# Patient Record
Sex: Female | Born: 2000 | Hispanic: Yes | Marital: Single | Smoking: Never smoker
Health system: Southern US, Community
[De-identification: ages and names within clinical notes are randomized; demographics above are authoritative.]

## PROBLEM LIST (undated history)

## (undated) ENCOUNTER — Ambulatory Visit: Admission: EM | Payer: Medicaid Other | Source: Home / Self Care

## (undated) DIAGNOSIS — J45909 Unspecified asthma, uncomplicated: Secondary | ICD-10-CM

## (undated) DIAGNOSIS — IMO0001 Reserved for inherently not codable concepts without codable children: Secondary | ICD-10-CM

## (undated) DIAGNOSIS — Z464 Encounter for fitting and adjustment of orthodontic device: Secondary | ICD-10-CM

## (undated) HISTORY — PX: TONSILLECTOMY: SUR1361

## (undated) HISTORY — PX: NO PAST SURGERIES: SHX2092

---
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
## 2020-08-20 IMAGING — US US OB LIMITED
1 series · 14 of 14 positions shown · non-contrast
Comparison: none

CLINICAL DATA: Evaluate AFI

EXAM:
LIMITED OBSTETRIC ULTRASOUND

[Series 1: us ob limited · 14 of 14 slices shown]
[im 1/14]
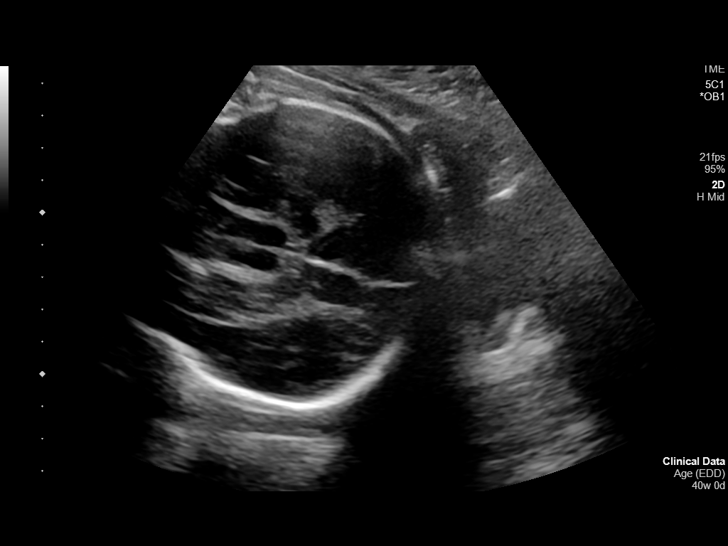
[im 2/14]
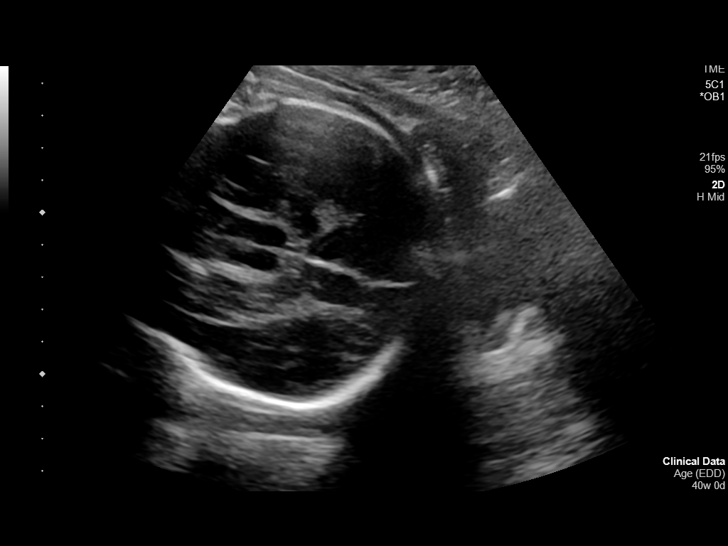
[im 3/14]
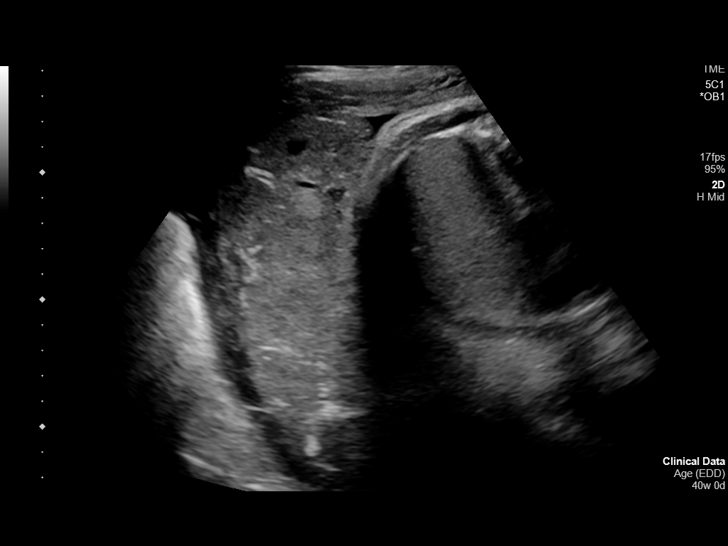
[im 4/14]
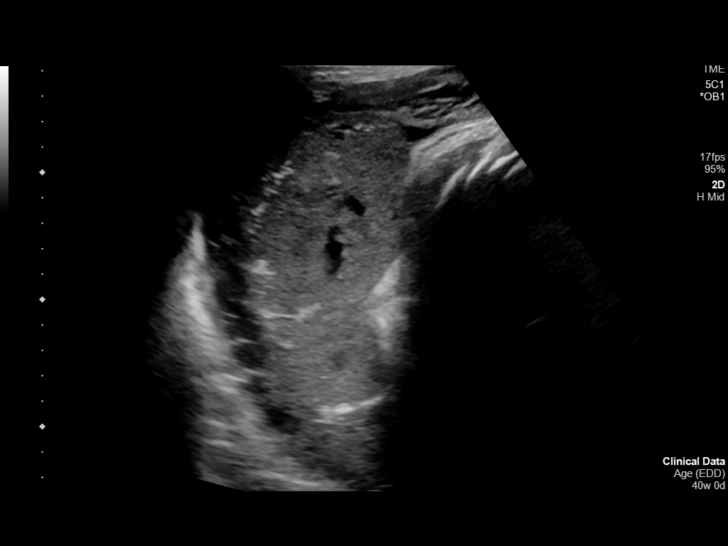
[im 5/14]
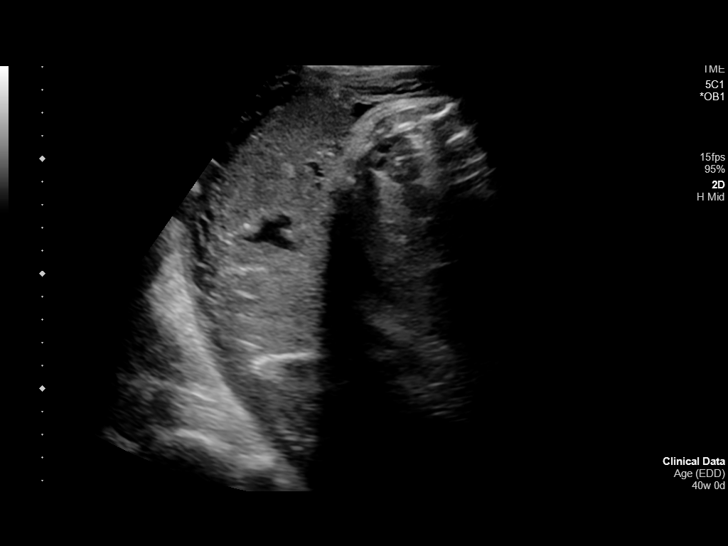
[im 6/14]
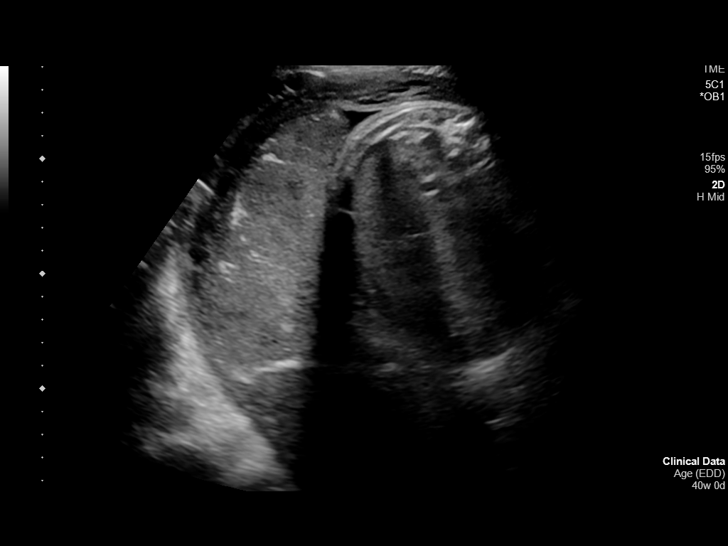
[im 7/14]
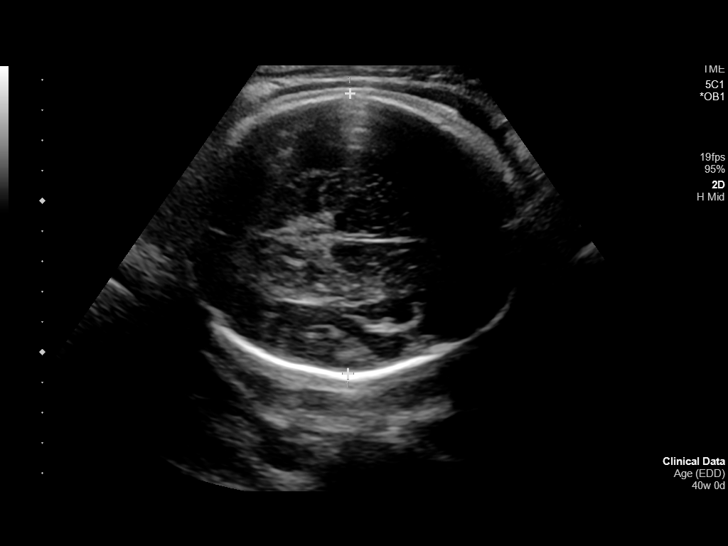
[im 8/14]
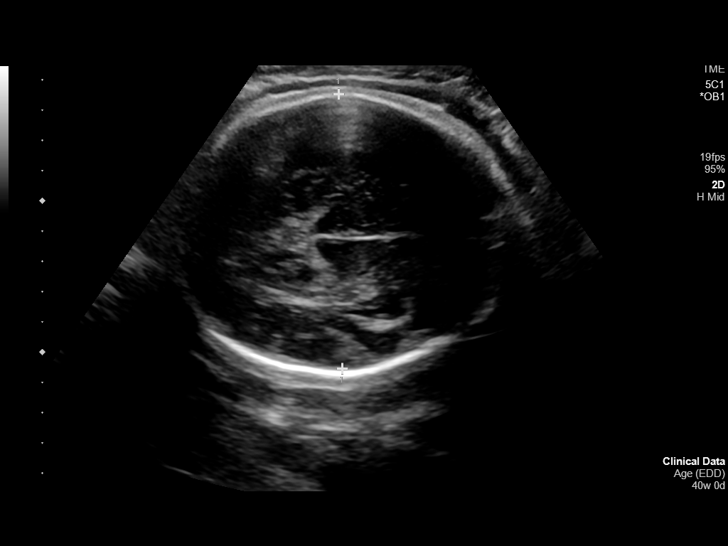
[im 9/14]
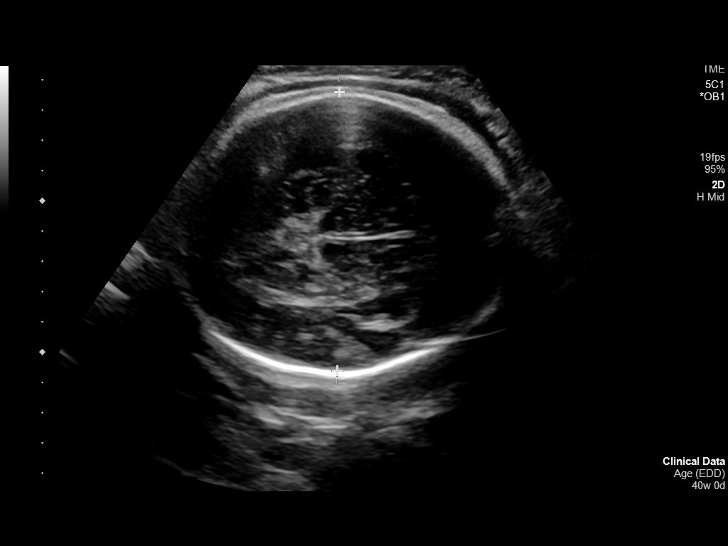
[im 10/14]
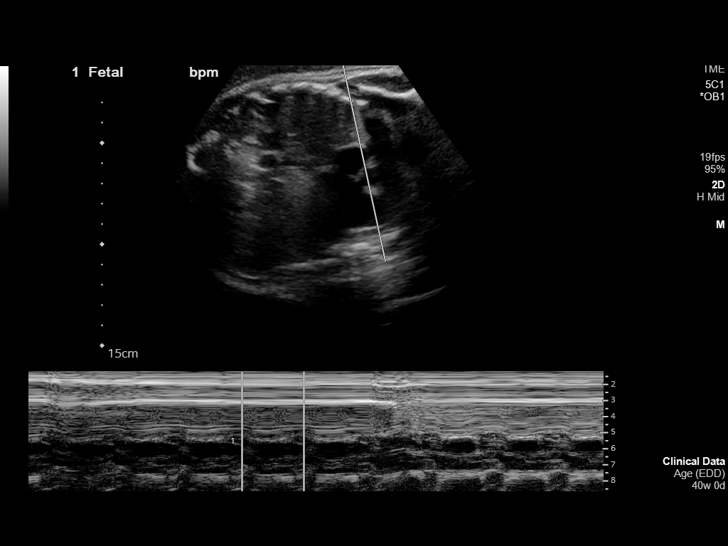
[im 11/14]
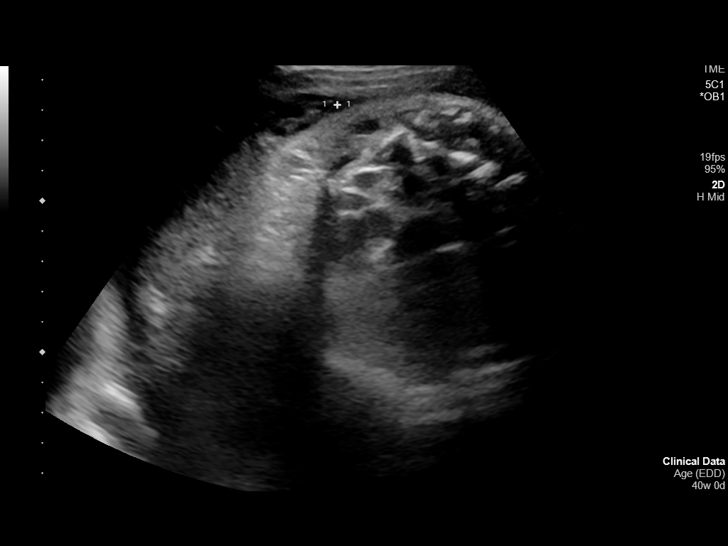
[im 12/14]
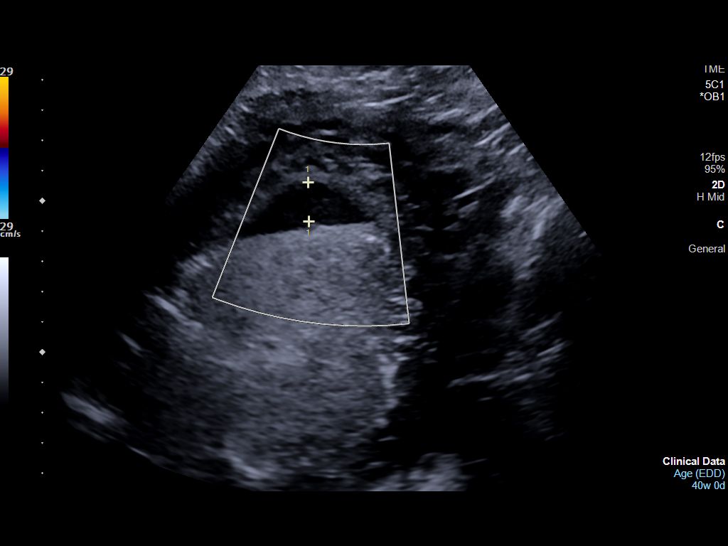
[im 13/14]
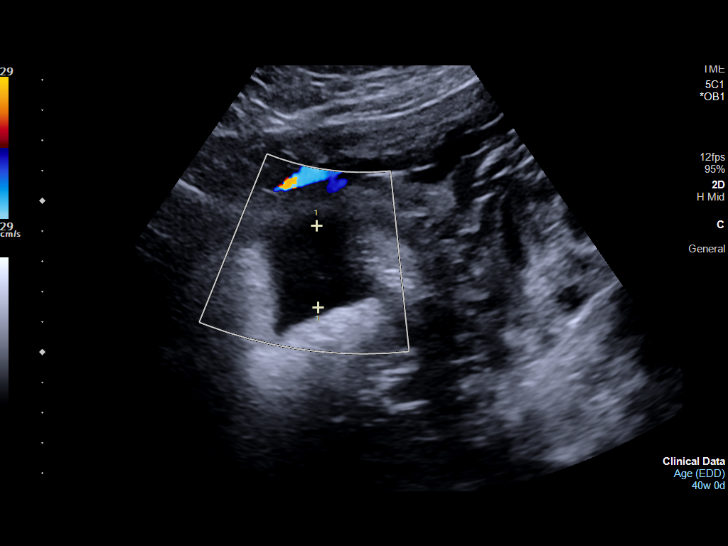
[im 14/14]
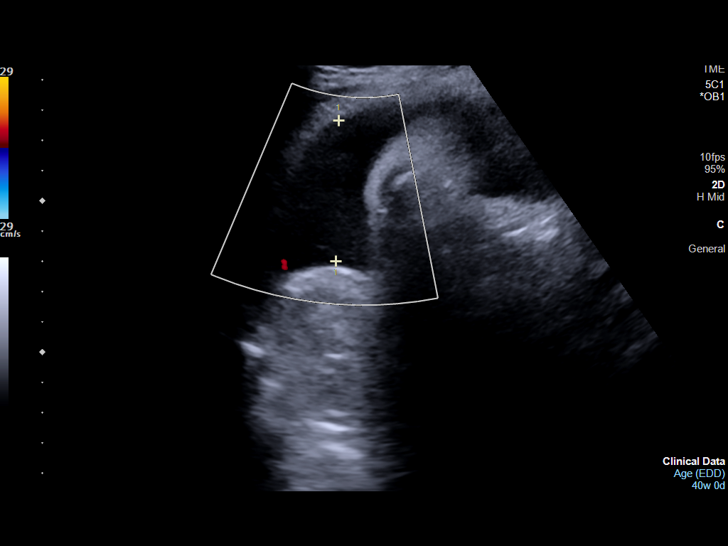

[14 of 14 positions shown; findings below may reference images not displayed]

FINDINGS: Number of Fetuses: 1

Heart Rate:  165 bpm

Movement: Present

Presentation: Cephalic

Placental Location: Right lateral

Previa: Absent

Amniotic Fluid (Subjective):  Normal

AFI: 8.6 cm

BPD: 9.2 cm 37 w  2 d
IMPRESSION: Single live intrauterine gestation at 37 weeks 2 days.

AFI is calculated at 8.6 cm.

This exam is performed on an emergent basis and does not
comprehensively evaluate fetal size, dating, or anatomy; follow-up
complete OB US should be considered if further fetal assessment is
warranted.

## 2020-08-21 ENCOUNTER — Other Ambulatory Visit: Payer: Self-pay

## 2020-08-21 ENCOUNTER — Ambulatory Visit
Admission: EM | Admit: 2020-08-21 | Discharge: 2020-08-21 | Disposition: A | Discharge status: Home / Self Care | Payer: Medicaid Other | Attending: Physician Assistant | Admitting: Physician Assistant

## 2020-08-21 DIAGNOSIS — M25511 Pain in right shoulder: Secondary | ICD-10-CM | POA: Diagnosis not present

## 2020-08-21 DIAGNOSIS — M549 Dorsalgia, unspecified: Secondary | ICD-10-CM | POA: Diagnosis not present

## 2020-08-21 MED ORDER — CYCLOBENZAPRINE HCL 10 MG PO TABS
10.0000 mg | ORAL_TABLET | Freq: Three times a day (TID) | ORAL | 0 refills | Status: DC | PRN
Start: 2020-08-21 — End: 2021-05-29

## 2020-08-21 MED ORDER — MELOXICAM 15 MG PO TABS
15.0000 mg | ORAL_TABLET | Freq: Every day | ORAL | 0 refills | Status: AC
Start: 2020-08-21 — End: 2020-09-05

## 2020-08-21 NOTE — ED Provider Notes (Signed)
[facility]    CSN: [id number] Arrival date & time: 08/21/20  1536      History   Chief Complaint Chief Complaint  Patient presents with   Back Pain    HPI Deborah Cortez is a 20 y.o. female presenting for approximately 3 to 4-day history of right sided upper and mid back pain with radiation to the right shoulder.  Patient denies any injury.  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  She says she has increase in pain whenever she raises her right arm or reaches behind her back.  She also is uncomfortable with lying flat.  Patient denies any lower back pain.  No neck pain. She says that this morning she woke up and her right hand was numb but it is no longer numb.  She denies any weakness and has not dropped anything.  She denies any pain in her chest or shortness of breath.  She says she has tried ibuprofen and Aleve without any improvement in her symptoms.  She denies any similar problems in the past but does admit that she "has back problems."  Patient says that she seen a chiropractor for back pain in the past.  She has no other complaints or concerns.  HPI  Past Medical History:  Diagnosis Date   Asthma    Orthodontics    braces    Patient Active Problem List   Diagnosis Date Noted   Postoperative state 01/23/2019   Spotting affecting pregnancy in third trimester 01/21/2019   Encounter for induction of labor 01/21/2019   Labor and delivery indication for care or intervention 01/21/2019    Past Surgical History:  Procedure Laterality Date   CESAREAN SECTION N/A 01/22/2019   Procedure: CESAREAN SECTION;  Surgeon: Schermerhorn, Ihor Austin, MD;  Location: [facility];  Service: Obstetrics;  Laterality: N/A;   TONSILLECTOMY     TONSILLECTOMY AND ADENOIDECTOMY N/A 03/09/2016   Procedure: TONSILLECTOMY AND ADENOIDECTOMY;  Surgeon: Geanie Logan, MD;  Location: [facility];  Service: ENT;  Laterality: N/A;    OB History     Gravida  1   Para  1   Term  1    Preterm      AB      Living  1      SAB      IAB      Ectopic      Multiple  0   Live Births  1            Home Medications    Prior to Admission medications   Medication Sig Start Date End Date Taking? Authorizing Provider  cyclobenzaprine (FLEXERIL) 10 MG tablet Take 1 tablet (10 mg total) by mouth 3 (three) times daily as needed for muscle spasms. 08/21/20  Yes Shirlee Latch, PA-C  meloxicam (MOBIC) 15 MG tablet Take 1 tablet (15 mg total) by mouth daily for 15 days. 08/21/20 09/05/20 Yes Eusebio Friendly B, PA-C  albuterol (PROVENTIL HFA;VENTOLIN HFA) 108 (90 Base) MCG/ACT inhaler Inhale into the lungs every 6 (six) hours as needed for wheezing or shortness of breath.    [provider]  cetirizine-pseudoephedrine (ZYRTEC-D) 5-120 MG tablet Take 1 tablet by mouth 2 (two) times daily. 07/04/19   Everlene Other G, DO  ipratropium (ATROVENT) 0.06 % nasal spray Place 2 sprays into both nostrils 4 (four) times daily as needed for rhinitis. 07/04/19   Tommie Sams, DO  ondansetron (ZOFRAN ODT) 4 MG disintegrating tablet Take 1  tablet (4 mg total) by mouth every 8 (eight) hours as needed for nausea or vomiting. 07/11/19   Renford Dills, NP  ferrous sulfate 325 (65 FE) MG tablet Take 1 tablet (325 mg total) by mouth 2 (two) times daily with a meal. 01/25/19 07/04/19  Genia Del, CNM    Family History Family History  Problem Relation Age of Onset   Hyperlipidemia Mother        diet controlled   Other Father        unknown - no contact   Leukemia Maternal Grandmother     Social History Social History   Tobacco Use   Smoking status: Never   Smokeless tobacco: Never  Vaping Use   Vaping Use: Never used  Substance Use Topics   Alcohol use: No   Drug use: Never     Allergies   Patient has no known allergies.   Review of Systems Review of Systems  Constitutional:  Negative for fatigue and fever.  Cardiovascular:  Negative for chest pain.   Gastrointestinal:  Negative for abdominal pain, nausea and vomiting.  Genitourinary:  Negative for dysuria, frequency and hematuria.  Musculoskeletal:  Positive for arthralgias and back pain. Negative for gait problem, joint swelling, neck pain and neck stiffness.  Skin:  Negative for rash and wound.  Neurological:  Positive for numbness. Negative for weakness.    Physical Exam Triage Vital Signs ED Triage Vitals [08/21/20 1543]  Enc Vitals Group     BP 116/69     Pulse Rate 90     Resp 16     Temp 99.2 F (37.3 C)     Temp Source Oral     SpO2 96 %     Weight 172 lb (78 kg)     Height 5\' 3"  (1.6 m)     Head Circumference      Peak Flow      Pain Score 6     Pain Loc      Pain Edu?      Excl. in [place]?    No data found.  Updated Vital Signs BP 116/69   Pulse 90   Temp 99.2 F (37.3 C) (Oral)   Resp 16   Ht 5\' 3"  (1.6 m)   Wt 172 lb (78 kg)   LMP 08/14/2020   SpO2 96%   BMI 30.47 kg/m       Physical Exam Vitals and nursing note reviewed.  Constitutional:      General: She is not in acute distress.    Appearance: Normal appearance. She is not ill-appearing or toxic-appearing.  HENT:     Head: Normocephalic and atraumatic.  Eyes:     General: No scleral icterus.       Right eye: No discharge.        Left eye: No discharge.     Conjunctiva/sclera: Conjunctivae normal.  Cardiovascular:     Rate and Rhythm: Normal rate and regular rhythm.     Heart sounds: Normal heart sounds.  Pulmonary:     Effort: Pulmonary effort is normal. No respiratory distress.     Breath sounds: Normal breath sounds.  Abdominal:     Palpations: Abdomen is soft.     Tenderness: There is no abdominal tenderness. There is no right CVA tenderness or left CVA tenderness.  Musculoskeletal:     Cervical back: Normal and neck supple.     Thoracic back: Tenderness (Diffuse TTP right scapular region, right parathoracic vertebral muscles and  right anterior shoulder) present. No bony  tenderness. Normal range of motion.     Lumbar back: Normal.       Back:     Comments: FROM of shoulder but has pain with full flexion and reaching behind her back. 5/5 strength bilat UEs  Skin:    General: Skin is dry.  Neurological:     General: No focal deficit present.     Mental Status: She is alert. Mental status is at baseline.     Motor: No weakness.     Gait: Gait normal.  Psychiatric:        Mood and Affect: Mood normal.        Behavior: Behavior normal.        Thought Content: Thought content normal.     UC Treatments / Results  Labs (all labs ordered are listed, but only abnormal results are displayed) Labs Reviewed - No data to display  EKG   Radiology No results found.  Procedures Procedures (including critical care time)  Medications Ordered in UC Medications - No data to display  Initial Impression / Assessment and Plan / UC Course  I have reviewed the triage vital signs and the nursing notes.  Pertinent labs & imaging results that were available during my care of the patient were reviewed by me and considered in my medical decision making (see chart for details).  20 year old female presenting for right upper back pain, right shoulder pain with occasional radiation down the right arm just above the right elbow.  Hand numbness this morning but resolved.  No neck pain and no lower back pain.  Exam today is significant for diffuse tenderness to palpation of the posterior scapular region, thoracic paravertebral muscles and anterior shoulder.  Full range of motion of shoulder but has pain with full flexion and reaching behind back.  5 of 5 strength bilateral extremities.  The remainder the exam is within normal limits.  Suspect patient has strain of upper back and shoulder, possibly related to work or lifting her child.  Suggested conservative care.  I have sent in meloxicam and advised she can also take Tylenol.  Have also sent in cyclobenzaprine.  Advised  various muscle rubs and lidocaine patches.  Advised to see orthopedics if she is not improving over the next 7 to 10 days or for any worsening of symptoms.  Work note given for today and tomorrow if needed.  ED precautions reviewed.  Final Clinical Impressions(s) / UC Diagnoses   Final diagnoses:  Upper back pain on right side  Acute pain of right shoulder     Discharge Instructions      BACK/SHOULDER PAIN: Stressed avoiding painful activities . RICE (REST, ICE, COMPRESSION, ELEVATION) guidelines reviewed. May alternate ice and heat. Consider use of muscle rubs, Salonpas patches, etc. Use medications as directed including muscle relaxers if prescribed. Take anti-inflammatory medications as prescribed or OTC NSAIDs/Tylenol.  F/u with PCP in 7-10 days for reexamination, and please feel free to call or return to the urgent care at any time for any questions or concerns you may have and we will be happy to help you!   BACK PAIN RED FLAGS: If the back pain acutely worsens or there are any red flag symptoms such as numbness/tingling, leg weakness, saddle anesthesia, or loss of bowel/bladder control, go immediately to the ER. Follow up with us as scheduled or sooner if the pain does not begin to resolve or if it worsens before the follow up  ED Prescriptions     Medication Sig Dispense Auth. Provider   meloxicam (MOBIC) 15 MG tablet Take 1 tablet (15 mg total) by mouth daily for 15 days. 15 tablet Eusebio Friendly B, PA-C   cyclobenzaprine (FLEXERIL) 10 MG tablet Take 1 tablet (10 mg total) by mouth 3 (three) times daily as needed for muscle spasms. 20 tablet Gareth Morgan      PDMP not reviewed this encounter.   Shirlee Latch, PA-C 08/21/20 1626

## 2020-08-21 NOTE — Discharge Instructions (Addendum)
BACK/SHOULDER PAIN: Stressed avoiding painful activities . RICE (REST, ICE, COMPRESSION, ELEVATION) guidelines reviewed. May alternate ice and heat. Consider use of muscle rubs, Salonpas patches, etc. Use medications as directed including muscle relaxers if prescribed. Take anti-inflammatory medications as prescribed or OTC NSAIDs/Tylenol.  F/u with PCP in 7-10 days for reexamination, and please feel free to call or return to the urgent care at any time for any questions or concerns you may have and we will be happy to help you!   BACK PAIN RED FLAGS: If the back pain acutely worsens or there are any red flag symptoms such as numbness/tingling, leg weakness, saddle anesthesia, or loss of bowel/bladder control, go immediately to the ER. Follow up with [place] as scheduled or sooner if the pain does not begin to resolve or if it worsens before the follow up

## 2020-08-21 NOTE — ED Triage Notes (Signed)
Pt reports having R sided back pain since Monday. No known injury to back. Denies having any other symptoms.

## 2021-05-29 ENCOUNTER — Other Ambulatory Visit: Payer: Self-pay

## 2021-05-29 ENCOUNTER — Ambulatory Visit
Admission: EM | Admit: 2021-05-29 | Discharge: 2021-05-29 | Disposition: A | Discharge status: Home / Self Care | Payer: Medicaid Other | Attending: Internal Medicine | Admitting: Internal Medicine

## 2021-05-29 ENCOUNTER — Encounter: Payer: Self-pay | Admitting: Emergency Medicine

## 2021-05-29 DIAGNOSIS — N760 Acute vaginitis: Secondary | ICD-10-CM | POA: Insufficient documentation

## 2021-05-29 DIAGNOSIS — R1030 Lower abdominal pain, unspecified: Secondary | ICD-10-CM | POA: Insufficient documentation

## 2021-05-29 DIAGNOSIS — B9689 Other specified bacterial agents as the cause of diseases classified elsewhere: Secondary | ICD-10-CM | POA: Insufficient documentation

## 2021-05-29 LAB — URINALYSIS, ROUTINE W REFLEX MICROSCOPIC
Bilirubin Urine: NEGATIVE
Glucose, UA: NEGATIVE mg/dL
Hgb urine dipstick: NEGATIVE
Ketones, ur: NEGATIVE mg/dL
Leukocytes,Ua: NEGATIVE
Nitrite: NEGATIVE
Protein, ur: NEGATIVE mg/dL
Specific Gravity, Urine: 1.01 (ref 1.005–1.030)
pH: 7 (ref 5.0–8.0)

## 2021-05-29 LAB — WET PREP, GENITAL
Sperm: NONE SEEN
Trich, Wet Prep: NONE SEEN
WBC, Wet Prep HPF POC: <10 — AB (ref <10)

## 2021-05-29 LAB — PREGNANCY, URINE: Preg Test, Ur: NEGATIVE

## 2021-05-29 MED ORDER — METRONIDAZOLE 500 MG PO TABS: 500.0000 mg | ORAL_TABLET | Freq: Two times a day (BID) | ORAL | 0 refills | Status: AC

## 2021-05-29 NOTE — ED Triage Notes (Signed)
Patient c/o abdominal pain off and for the past week.  Patient denies any urinary symptoms.  Patient denies vaginal discharge.  Patient states that she is sexually active.  Patient denies fevers.  Patient reports some nausea.  Patient denies V/D.  Patient reports normal bowel movement this morning.  ?

## 2021-05-29 NOTE — ED Provider Notes (Signed)
?[facility] ? ? ? ?CSN: [id number] ?Arrival date & time: 05/29/21  1518 ? ? ?  ? ?History   ?Chief Complaint ?Chief Complaint  ?Patient presents with  ? Abdominal Pain  ? ? ?HPI ?Deborah Cortez is a 21 y.o. female comes to the urgent care with 1 week history of intermittent lower abdominal pain.  Pain is sharp, started spontaneously and aggravated by assuming certain positions.  Patient denies any relieving factors.  No nausea, vomiting or diarrhea.  She has a history of constipation but her bowel movements have been regular recently.  Last bowel movement was this morning with no blood.  Bowel movement was soft.  No abdominal bloating or distention.  Patient has regular periods with the last menstrual period ending at the beginning of April.  She is not sexually active and denies any vaginal discharge or vaginal odor.  No dysuria urgency or frequency.  No trauma to the abdomen or heavy lifting.  No pain in the inguinal areas.  No fever or chills. ?HPI ? ?Past Medical History:  ?Diagnosis Date  ? Asthma   ? Orthodontics   ? braces  ? ? ?Patient Active Problem List  ? Diagnosis Date Noted  ? Postoperative state 01/23/2019  ? Spotting affecting pregnancy in third trimester 01/21/2019  ? Encounter for induction of labor 01/21/2019  ? Labor and delivery indication for care or intervention 01/21/2019  ? ? ?Past Surgical History:  ?Procedure Laterality Date  ? CESAREAN SECTION N/A 01/22/2019  ? Procedure: CESAREAN SECTION;  Surgeon: Schermerhorn, Gwen Her, MD;  Location: [facility];  Service: Obstetrics;  Laterality: N/A;  ? TONSILLECTOMY    ? TONSILLECTOMY AND ADENOIDECTOMY N/A 03/09/2016  ? Procedure: TONSILLECTOMY AND ADENOIDECTOMY;  Surgeon: Clyde Canterbury, MD;  Location: [facility];  Service: ENT;  Laterality: N/A;  ? ? ?OB History   ? ? Gravida  ?1  ? Para  ?1  ? Term  ?1  ? Preterm  ?   ? AB  ?   ? Living  ?1  ?  ? ? SAB  ?   ? IAB  ?   ? Ectopic  ?   ? Multiple  ?0  ? Live Births  ?1  ?    ?  ?  ? ? ? ?Home Medications   ? ?Prior to Admission medications   ?Medication Sig Start Date End Date Taking? Authorizing Provider  ?metroNIDAZOLE (FLAGYL) 500 MG tablet Take 1 tablet (500 mg total) by mouth 2 (two) times daily for 7 days. 05/29/21 06/05/21 Yes Sevon Rotert, Myrene Galas, MD  ?albuterol (PROVENTIL HFA;VENTOLIN HFA) 108 (90 Base) MCG/ACT inhaler Inhale into the lungs every 6 (six) hours as needed for wheezing or shortness of breath.    [provider]  ?cetirizine-pseudoephedrine (ZYRTEC-D) 5-120 MG tablet Take 1 tablet by mouth 2 (two) times daily. 07/04/19   Coral Spikes, DO  ?ipratropium (ATROVENT) 0.06 % nasal spray Place 2 sprays into both nostrils 4 (four) times daily as needed for rhinitis. 07/04/19   Coral Spikes, DO  ?ferrous sulfate 325 (65 FE) MG tablet Take 1 tablet (325 mg total) by mouth 2 (two) times daily with a meal. 01/25/19 07/04/19  Lisette Grinder, CNM  ? ? ?Family History ?Family History  ?Problem Relation Age of Onset  ? Hyperlipidemia Mother   ?     diet controlled  ? Other Father   ?     unknown - no contact  ? Leukemia  Maternal Grandmother   ? ? ?Social History ?Social History  ? ?Tobacco Use  ? Smoking status: Never  ? Smokeless tobacco: Never  ?Vaping Use  ? Vaping Use: Never used  ?Substance Use Topics  ? Alcohol use: No  ? Drug use: Never  ? ? ? ?Allergies   ?Patient has no known allergies. ? ? ?Review of Systems ?Review of Systems ?As per HPI ? ?Physical Exam ?Triage Vital Signs ?ED Triage Vitals  ?Enc Vitals Group  ?   BP 05/29/21 1531 118/77  ?   Pulse Rate 05/29/21 1531 70  ?   Resp 05/29/21 1531 14  ?   Temp 05/29/21 1531 98.7 ?F (37.1 ?C)  ?   Temp Source 05/29/21 1531 Oral  ?   SpO2 05/29/21 1531 98 %  ?   Weight 05/29/21 1527 166 lb (75.3 kg)  ?   Height --   ?   Head Circumference --   ?   Peak Flow --   ?   Pain Score 05/29/21 1527 7  ?   Pain Loc --   ?   Pain Edu? --   ?   Excl. in [place]? --   ? ?No data found. ? ?Updated Vital Signs ?BP 118/77 (BP Location:  Left Arm)   Pulse 70   Temp 98.7 ?F (37.1 ?C) (Oral)   Resp 14   Wt 75.3 kg   LMP 05/10/2021 (Approximate)   SpO2 98%   BMI 29.41 kg/m?  ? ?Visual Acuity ?Right Eye Distance:   ?Left Eye Distance:   ?Bilateral Distance:   ? ?Right Eye Near:   ?Left Eye Near:    ?Bilateral Near:    ? ?Physical Exam ?Vitals and nursing note reviewed.  ?Constitutional:   ?   General: She is not in acute distress. ?   Appearance: She is not ill-appearing.  ?Cardiovascular:  ?   Rate and Rhythm: Normal rate and regular rhythm.  ?Pulmonary:  ?   Effort: Pulmonary effort is normal.  ?   Breath sounds: Normal breath sounds.  ?Abdominal:  ?   Palpations: Abdomen is soft. There is no shifting dullness or fluid wave.  ?   Tenderness: There is abdominal tenderness in the suprapubic area. There is no right CVA tenderness, left CVA tenderness, guarding or rebound. Negative signs include Murphy's sign, McBurney's sign and obturator sign.  ?   Hernia: No hernia is present.  ?Neurological:  ?   Mental Status: She is alert.  ? ? ? ?UC Treatments / Results  ?Labs ?(all labs ordered are listed, but only abnormal results are displayed) ?Labs Reviewed  ?WET PREP, GENITAL - Abnormal; Notable for the following components:  ?    Result Value  ? Yeast Wet Prep HPF POC PRESENT (*)   ? Clue Cells Wet Prep HPF POC PRESENT (*)   ? WBC, Wet Prep HPF POC <10 (*)   ? All other components within normal limits  ?URINALYSIS, ROUTINE W REFLEX MICROSCOPIC  ?PREGNANCY, URINE  ? ? ?EKG ? ? ?Radiology ?No results found. ? ?Procedures ?Procedures (including critical care time) ? ?Medications Ordered in UC ?Medications - No data to display ? ?Initial Impression / Assessment and Plan / UC Course  ?I have reviewed the triage vital signs and the nursing notes. ? ?Pertinent labs & imaging results that were available during my care of the patient were reviewed by me and considered in my medical decision making (see chart for details). ? ?  ? ?  1.  Lower abdominal pain: ?I  suspect anterior abdominal wall pain ?Abdominal exam is without guarding or rebound tenderness.  McBurney's point tenderness is negative.  No signs of peritoneal irritation. ?Urinalysis is unremarkable ?Wet prep was positive for bacterial vaginosis. ?Flagyl twice daily for 7 days ?Patient is advised to follow-up with OB/GYN for transvaginal ultrasound to evaluate uterus and ovaries.  Abdominal exam is unimpressive for peritonitis. ?Return precautions recommended. ?Final Clinical Impressions(s) / UC Diagnoses  ? ?Final diagnoses:  ?Lower abdominal pain  ?Bacterial vaginosis  ? ? ? ?Discharge Instructions   ? ?  ?Please take medications as prescribed ?Do not drink alcohol while is on the medications ?Maintain adequate hydration ?You will need to follow-up with gynecologist for transvaginal ultrasound if abdominal pain persist ?Return to urgent care if symptoms worsen. ? ? ?ED Prescriptions   ? ? Medication Sig Dispense Auth. Provider  ? metroNIDAZOLE (FLAGYL) 500 MG tablet Take 1 tablet (500 mg total) by mouth 2 (two) times daily for 7 days. 14 tablet Ringo Sherod, Myrene Galas, MD  ? ?  ? ?PDMP not reviewed this encounter. ?  ?Chase Picket, MD ?05/29/21 1627 ? ?

## 2021-05-29 NOTE — Discharge Instructions (Signed)
Please take medications as prescribed ?Do not drink alcohol while is on the medications ?Maintain adequate hydration ?You will need to follow-up with gynecologist for transvaginal ultrasound if abdominal pain persist ?Return to urgent care if symptoms worsen. ?

## 2021-07-16 ENCOUNTER — Ambulatory Visit
Admission: EM | Admit: 2021-07-16 | Discharge: 2021-07-16 | Disposition: A | Discharge status: Home / Self Care | Payer: Medicaid Other | Attending: Emergency Medicine | Admitting: Emergency Medicine

## 2021-07-16 DIAGNOSIS — J069 Acute upper respiratory infection, unspecified: Secondary | ICD-10-CM | POA: Diagnosis not present

## 2021-07-16 DIAGNOSIS — H66002 Acute suppurative otitis media without spontaneous rupture of ear drum, left ear: Secondary | ICD-10-CM

## 2021-07-16 MED ORDER — AMOXICILLIN-POT CLAVULANATE 875-125 MG PO TABS: 1.0000 | ORAL_TABLET | Freq: Two times a day (BID) | ORAL | 0 refills | Status: AC

## 2021-07-16 MED ORDER — IPRATROPIUM BROMIDE 0.06 % NA SOLN: 2.0000 | Freq: Four times a day (QID) | NASAL | 12 refills | Status: DC

## 2021-07-16 MED ORDER — PROMETHAZINE-DM 6.25-15 MG/5ML PO SYRP: 5.0000 mL | ORAL_SOLUTION | Freq: Four times a day (QID) | ORAL | 0 refills | Status: DC | PRN

## 2021-07-16 NOTE — Discharge Instructions (Signed)
Take the Augmentin twice daily for 10 days with food for treatment of your ear infection.  Take an over-the-counter probiotic 1 hour after each dose of antibiotic to prevent diarrhea.  Use over-the-counter Tylenol and ibuprofen as needed for pain or fever.  Place a hot water bottle, or heating pad, underneath your pillowcase at night to help dilate up your ear and aid in pain relief as well as resolution of the infection.  Use the Atrovent nasal spray, 2 squirts in each nostril every 6 hours, as needed for runny nose and postnasal drip.  Use the Promethazine DM cough syrup at bedtime for cough and congestion.  It will make you drowsy so do not take it during the day.  Return for reevaluation or see your primary care provider for any new or worsening symptoms.   Return for reevaluation for any new or worsening symptoms.  

## 2021-07-16 NOTE — ED Triage Notes (Signed)
Patient presents to UC for a cough for about 2 weeks.  Patient states it affects her more at night.  She has been taking OTC medication but this has not helped.  Woke up this AM with body aches.  Patient denies fevers.

## 2021-07-16 NOTE — ED Provider Notes (Signed)
[facility]    CSN: [id number] Arrival date & time: 07/16/21  1435      History   Chief Complaint Chief Complaint  Patient presents with   Cough   Generalized Body Aches    HPI Tommie ArdBeatriz N Parada Dominguez is a 21 y.o. female.   HPI  21 year old female here for evaluation of respiratory complaints.  Patient reports that for the last 2 weeks she has been experiencing a cough that is predominant at night and has been dry until recently.  She now reports that her cough is starting to become more productive.  She is also developed runny nose, nasal congestion, ear pain, and sore throat in the last 1 to 2 days.  This morning she woke up with some body aches.  She denies any fever, shortness of breath, or wheezing.  Past Medical History:  Diagnosis Date   Asthma    Orthodontics    braces    Patient Active Problem List   Diagnosis Date Noted   Postoperative state 01/23/2019   Spotting affecting pregnancy in third trimester 01/21/2019   Encounter for induction of labor 01/21/2019   Labor and delivery indication for care or intervention 01/21/2019    Past Surgical History:  Procedure Laterality Date   CESAREAN SECTION N/A 01/22/2019   Procedure: CESAREAN SECTION;  Surgeon: Schermerhorn, Ihor Austinhomas J, MD;  Location: [facility];  Service: Obstetrics;  Laterality: N/A;   TONSILLECTOMY     TONSILLECTOMY AND ADENOIDECTOMY N/A 03/09/2016   Procedure: TONSILLECTOMY AND ADENOIDECTOMY;  Surgeon: Geanie LoganPaul Bennett, MD;  [facility];  Service: ENT;  Laterality: N/A;    OB History     Gravida  1   Para  1   Term  1   Preterm      AB      Living  1      SAB      IAB      Ectopic      Multiple  0   Live Births  1            Home Medications    Prior to Admission medications   Medication Sig Start Date End Date Taking? Authorizing Provider  amoxicillin-clavulanate (AUGMENTIN) 875-125 MG tablet Take 1 tablet by mouth every 12 (twelve) hours  for 10 days. 07/16/21 07/26/21 Yes Becky Augustayan, Orion Mole, NP  ipratropium (ATROVENT) 0.06 % nasal spray Place 2 sprays into both nostrils 4 (four) times daily. 07/16/21  Yes Becky Augustayan, Sueellen Kayes, NP  promethazine-dextromethorphan (PROMETHAZINE-DM) 6.25-15 MG/5ML syrup Take 5 mLs by mouth 4 (four) times daily as needed. 07/16/21  Yes Becky Augustayan, Adell Koval, NP  albuterol (PROVENTIL HFA;VENTOLIN HFA) 108 (90 Base) MCG/ACT inhaler Inhale into the lungs every 6 (six) hours as needed for wheezing or shortness of breath.    [provider]  ferrous sulfate 325 (65 FE) MG tablet Take 1 tablet (325 mg total) by mouth 2 (two) times daily with a meal. 01/25/19 07/04/19  Genia DelHaviland, Margaret, CNM    Family History Family History  Problem Relation Age of Onset   Hyperlipidemia Mother        diet controlled   Other Father        unknown - no contact   Leukemia Maternal Grandmother     Social History Social History   Tobacco Use   Smoking status: Never   Smokeless tobacco: Never  Vaping Use   Vaping Use: Never used  Substance Use Topics   Alcohol use: No  Drug use: Never     Allergies   Patient has no known allergies.   Review of Systems Review of Systems  Constitutional:  Negative for fever.  HENT:  Positive for congestion, ear pain, rhinorrhea and sore throat.   Respiratory:  Positive for cough. Negative for shortness of breath and wheezing.   Hematological: Negative.   Psychiatric/Behavioral: Negative.       Physical Exam Triage Vital Signs ED Triage Vitals  Enc Vitals Group     BP 07/16/21 1449 108/83     Pulse Rate 07/16/21 1449 74     Resp --      Temp 07/16/21 1449 98.3 F (36.8 C)     Temp Source 07/16/21 1449 Oral     SpO2 07/16/21 1449 100 %     Weight 07/16/21 1447 167 lb (75.8 kg)     Height 07/16/21 1447 5\' 3"  (1.6 m)     Head Circumference --      Peak Flow --      Pain Score 07/16/21 1447 6     Pain Loc --      Pain Edu? --      Excl. in [place]? --    No data found.  Updated  Vital Signs BP 108/83 (BP Location: Right Arm)   Pulse 74   Temp 98.3 F (36.8 C) (Oral)   Ht 5\' 3"  (1.6 m)   Wt 167 lb (75.8 kg)   LMP 06/29/2021   SpO2 100%   BMI 29.58 kg/m   Visual Acuity Right Eye Distance:   Left Eye Distance:   Bilateral Distance:    Right Eye Near:   Left Eye Near:    Bilateral Near:     Physical Exam Vitals and nursing note reviewed.  Constitutional:      Appearance: Normal appearance. She is not ill-appearing.  HENT:     Head: Normocephalic and atraumatic.     Right Ear: Tympanic membrane, ear canal and external ear normal. There is no impacted cerumen.     Left Ear: Ear canal and external ear normal. There is no impacted cerumen.     Nose: Congestion and rhinorrhea present.     Mouth/Throat:     Mouth: Mucous membranes are moist.     Pharynx: Oropharynx is clear. Posterior oropharyngeal erythema present.  Cardiovascular:     Rate and Rhythm: Normal rate and regular rhythm.     Pulses: Normal pulses.     Heart sounds: Normal heart sounds. No murmur heard.    No friction rub. No gallop.  Pulmonary:     Effort: Pulmonary effort is normal.     Breath sounds: Normal breath sounds. No wheezing, rhonchi or rales.  Musculoskeletal:     Cervical back: Normal range of motion and neck supple.  Lymphadenopathy:     Cervical: No cervical adenopathy.  Skin:    General: Skin is warm and dry.     Capillary Refill: Capillary refill takes less than 2 seconds.     Findings: No erythema or rash.  Neurological:     General: No focal deficit present.     Mental Status: She is alert and oriented to person, place, and time.  Psychiatric:        Mood and Affect: Mood normal.        Behavior: Behavior normal.        Thought Content: Thought content normal.        Judgment: Judgment normal.  UC Treatments / Results  Labs (all labs ordered are listed, but only abnormal results are displayed) Labs Reviewed - No data to  display  EKG   Radiology No results found.  Procedures Procedures (including critical care time)  Medications Ordered in UC Medications - No data to display  Initial Impression / Assessment and Plan / UC Course  I have reviewed the triage vital signs and the nursing notes.  Pertinent labs & imaging results that were available during my care of the patient were reviewed by me and considered in my medical decision making (see chart for details).  Patient is a very pleasant, nontoxic-appearing 21 year old female here for evaluation of respiratory complaints as outlined HPI above.  Her physical exam reveals an erythematous and injected left tympanic membrane.  The external auditory canal is clear.  Right TM is pearly gray in appearance with normal light reflex and clear external auditory canal.  Nasal mucosa is erythematous and edematous with clear discharge in both nares.  Oropharyngeal exam reveals posterior oropharyngeal erythema and clear postnasal drip.  No anterior cervical of adenopathy appreciable exam.  Cardiopulmonary exam feels S1-S2 heart sounds with regular rate and rhythm and lung sounds that are clear to auscultation in all fields.  Patient exam is consistent with an upper respiratory infection and otitis media of the left ear.  I will place her on Augmentin twice daily for 10 days for treatment of her otitis media and prescribe Atrovent nasal spray to help with nasal congestion and postnasal drip and Promethazine DM cough syrup to help with cough at bedtime.  Work note provided   Final Clinical Impressions(s) / UC Diagnoses   Final diagnoses:  Upper respiratory tract infection, unspecified type  Non-recurrent acute suppurative otitis media of left ear without spontaneous rupture of tympanic membrane     Discharge Instructions      Take the Augmentin twice daily for 10 days with food for treatment of your ear infection.  Take an over-the-counter probiotic 1 hour after  each dose of antibiotic to prevent diarrhea.  Use over-the-counter Tylenol and ibuprofen as needed for pain or fever.  Place a hot water bottle, or heating pad, underneath your pillowcase at night to help dilate up your ear and aid in pain relief as well as resolution of the infection.  Use the Atrovent nasal spray, 2 squirts in each nostril every 6 hours, as needed for runny nose and postnasal drip.  Use the Promethazine DM cough syrup at bedtime for cough and congestion.  It will make you drowsy so do not take it during the day.  Return for reevaluation or see your primary care provider for any new or worsening symptoms.   Return for reevaluation for any new or worsening symptoms.      ED Prescriptions     Medication Sig Dispense Auth. Provider   amoxicillin-clavulanate (AUGMENTIN) 875-125 MG tablet Take 1 tablet by mouth every 12 (twelve) hours for 10 days. 20 tablet Becky Augusta, NP   ipratropium (ATROVENT) 0.06 % nasal spray Place 2 sprays into both nostrils 4 (four) times daily. 15 mL Becky Augusta, NP   promethazine-dextromethorphan (PROMETHAZINE-DM) 6.25-15 MG/5ML syrup Take 5 mLs by mouth 4 (four) times daily as needed. 118 mL Becky Augusta, NP      PDMP not reviewed this encounter.   Becky Augusta, NP 07/16/21 [phone]

## 2021-10-22 ENCOUNTER — Ambulatory Visit
Admission: EM | Admit: 2021-10-22 | Discharge: 2021-10-22 | Disposition: A | Discharge status: Home / Self Care | Payer: Medicaid Other | Attending: Family Medicine | Admitting: Family Medicine

## 2021-10-22 DIAGNOSIS — M545 Low back pain, unspecified: Secondary | ICD-10-CM | POA: Diagnosis not present

## 2021-10-22 DIAGNOSIS — R0981 Nasal congestion: Secondary | ICD-10-CM | POA: Insufficient documentation

## 2021-10-22 DIAGNOSIS — B9789 Other viral agents as the cause of diseases classified elsewhere: Secondary | ICD-10-CM | POA: Insufficient documentation

## 2021-10-22 DIAGNOSIS — J329 Chronic sinusitis, unspecified: Secondary | ICD-10-CM | POA: Insufficient documentation

## 2021-10-22 DIAGNOSIS — K5900 Constipation, unspecified: Secondary | ICD-10-CM | POA: Insufficient documentation

## 2021-10-22 DIAGNOSIS — Z20822 Contact with and (suspected) exposure to covid-19: Secondary | ICD-10-CM | POA: Diagnosis not present

## 2021-10-22 LAB — SARS CORONAVIRUS 2 BY RT PCR: SARS Coronavirus 2 by RT PCR: NEGATIVE

## 2021-10-22 MED ORDER — CYCLOBENZAPRINE HCL 5 MG PO TABS: 10.0000 mg | ORAL_TABLET | Freq: Three times a day (TID) | ORAL | 0 refills | Status: DC | PRN

## 2021-10-22 NOTE — Discharge Instructions (Addendum)
For your back pain: Take 2 tablets of Tylenol 3 times a day with 2 tablets of ibuprofen as needed.  Stop by the pharmacy to pick up your muscle relaxers.  Do not drive or operate heavy machinery while using this medication (Flexeril).  We will contact you if your COVID test is positive.  Please quarantine while you wait for the results.  If your test is negative you may resume normal activities.  If your test is positive please continue to quarantine for at least 5 days from your symptom onset or until you are without a fever for at least 24 hours after the medications.   You can take Tylenol and/or Ibuprofen as needed for fever reduction and pain relief.    For cough: honey 1/2 to 1 teaspoon (you can dilute the honey in water or another fluid).  You can also use guaifenesin and dextromethorphan for cough. You can use a humidifier for chest congestion and cough.  If you don't have a humidifier, you can sit in the bathroom with the hot shower running.      For sore throat: try warm salt water gargles, cepacol lozenges, throat spray, warm tea or water with lemon/honey, popsicles or ice, or OTC cold relief medicine for throat discomfort.    For congestion: take a daily anti-histamine like Zyrtec, Claritin, and a oral decongestant, such as pseudoephedrine.  You can also use Flonase 1-2 sprays in each nostril daily.    It is important to stay hydrated: drink plenty of fluids (water, gatorade/powerade/pedialyte, juices, or teas) to keep your throat moisturized and help further relieve irritation/discomfort.    Return or go to the Emergency Department if symptoms worsen or do not improve in the next few days

## 2021-10-22 NOTE — ED Provider Notes (Signed)
[facility]    CSN: [id number] Arrival date & time: 10/22/21  1617      History   Chief Complaint Chief Complaint  Patient presents with   Nasal Congestion   Facial Pain    HPI Deborah Cortez is a 21 y.o. female.   HPI   Deborah Cortez presents for nasal congestion, fatigue and sinus pressure for the past 3 days. She has been taking Sudafed with relief of sinus pressure. She called out of work today as she was not able to sleep last night due to her symptoms. Has headache. Endorses chills but no fever. She has similar sx when she had a sinus infection in the past. Denies ear pain.   She has sharp pain in her lower back. She gets this pain once in a while.  Denies lifting anything heavy recently that she is aware of.  Does work with [employer], [employer] at work.  She has trouble with constipation and has been taking laxative pills which help. She takes the pills every day.  When she was younger, she was hospitalized with constipation.    Fever : no  Chills: yes Sore throat: no   Cough: no Sputum: no Nasal congestion : yes Rhinorrhea: no Myalgias: no Appetite: normal  Hydration: normal  Abdominal pain: no Nausea: no Vomiting: no Diarrhea: No Rash: no Sleep disturbance: yes Headache: yes     Past Medical History:  Diagnosis Date   Asthma    Orthodontics    braces    Patient Active Problem List   Diagnosis Date Noted   Postoperative state 01/23/2019   Spotting affecting pregnancy in third trimester 01/21/2019   Encounter for induction of labor 01/21/2019   Labor and delivery indication for care or intervention 01/21/2019    Past Surgical History:  Procedure Laterality Date   CESAREAN SECTION N/A 01/22/2019   Procedure: CESAREAN SECTION;  Surgeon: Schermerhorn, Ihor Austin, MD;  Location: [facility];  Service: Obstetrics;  Laterality: N/A;   TONSILLECTOMY     TONSILLECTOMY AND ADENOIDECTOMY N/A 03/09/2016   Procedure: TONSILLECTOMY AND  ADENOIDECTOMY;  Surgeon: Geanie Logan, MD;  Location: [facility];  Service: ENT;  Laterality: N/A;    OB History     Gravida  1   Para  1   Term  1   Preterm      AB      Living  1      SAB      IAB      Ectopic      Multiple  0   Live Births  1            Home Medications    Prior to Admission medications   Medication Sig Start Date End Date Taking? Authorizing Provider  cyclobenzaprine (FLEXERIL) 5 MG tablet Take 2 tablets (10 mg total) by mouth 3 (three) times daily as needed for muscle spasms. 10/22/21  Yes Kiwana Deblasi, DO  albuterol (PROVENTIL HFA;VENTOLIN HFA) 108 (90 Base) MCG/ACT inhaler Inhale into the lungs every 6 (six) hours as needed for wheezing or shortness of breath.    [provider]  ipratropium (ATROVENT) 0.06 % nasal spray Place 2 sprays into both nostrils 4 (four) times daily. 07/16/21   Becky Augusta, NP  promethazine-dextromethorphan (PROMETHAZINE-DM) 6.25-15 MG/5ML syrup Take 5 mLs by mouth 4 (four) times daily as needed. 07/16/21   Becky Augusta, NP  ferrous sulfate 325 (65 FE) MG tablet Take 1 tablet (325 mg total) by  mouth 2 (two) times daily with a meal. 01/25/19 07/04/19  Genia Del, CNM    Family History Family History  Problem Relation Age of Onset   Hyperlipidemia Mother        diet controlled   Other Father        unknown - no contact   Leukemia Maternal Grandmother     Social History Social History   Tobacco Use   Smoking status: Never   Smokeless tobacco: Never  Vaping Use   Vaping Use: Never used  Substance Use Topics   Alcohol use: No   Drug use: Never     Allergies   Patient has no known allergies.   Review of Systems Review of Systems: negative unless otherwise stated in HPI.      Physical Exam Triage Vital Signs ED Triage Vitals  Enc Vitals Group     BP      Pulse      Resp      Temp      Temp src      SpO2      Weight      Height      Head Circumference      Peak  Flow      Pain Score      Pain Loc      Pain Edu?      Excl. in GC?    No data found.  Updated Vital Signs BP 126/83 (BP Location: Left Arm)   Pulse 66   Temp 99.7 F (37.6 C) (Oral)   Ht 5\' 3"  (1.6 m)   Wt 75.8 kg   LMP 09/24/2021 (Approximate)   SpO2 98%   BMI 29.60 kg/m   Visual Acuity Right Eye Distance:   Left Eye Distance:   Bilateral Distance:    Right Eye Near:   Left Eye Near:    Bilateral Near:     Physical Exam GEN:     alert, non-ill appearing female in no distress    HENT:  mucus membranes moist, oropharyngeal without lesions or exudate, no tonsillar hypertrophy,  no oropharyngeal erythema ,  moderate erythematous hypertrophied turbinates, clear nasal discharge, bilateral TM normal, left-sided maxillary sinus tenderness, no right-sided axillary sinus or frontal sinus tenderness EYES:   pupils equal and reactive, EOMi, no scleral injection NECK:  normal ROM, no lymphadenopathy RESP:  no increased work of breathing CVS:   regular rate' MSK: Lumbar spine: - Inspection: no gross deformity or asymmetry, swelling or ecchymosis. No skin changes - Palpation: No TTP over the spinous processes or SI joints b/l, + tenderness palpation on the right lower paraspinal muscles - ROM: full active ROM of the lumbar spine in flexion and extension without pain - Strength: 5/5 strength of lower extremity in L4-S1 nerve root distributions b/l - Neuro: sensation intact in the L4-S1 nerve root distribution b/l - Special testing: Negative straight leg raise Skin:   warm and dry     UC Treatments / Results  Labs (all labs ordered are listed, but only abnormal results are displayed) Labs Reviewed  SARS CORONAVIRUS 2 BY RT PCR    EKG   Radiology No results found.  Procedures Procedures (including critical care time)  Medications Ordered in UC Medications - No data to display  Initial Impression / Assessment and Plan / UC Course  I have reviewed the triage vital  signs and the nursing notes.  Pertinent labs & imaging results that were available during my care of  the patient were reviewed by me and considered in my medical decision making (see chart for details).       Pt is a 21 y.o. female who presents for 3 days of respiratory symptoms. Deborah Cortez is afebrile here without recent antipyretics. Satting well on room air. Overall pt is well appearing, well hydrated, without respiratory distress.  She has some maxillary sinus tenderness on the left.  There has been no history of fevers.  COVID testing obtained and was negative. History consistent with viral sinusitis. Discussed symptomatic treatment.  Explained lack of efficacy of antibiotics in viral disease.  She has history of intermittent low back pain.  On exam, she has tenderness on the right lower lumbar paraspinals.  Discussed with patient gradually returning to normal activities, as tolerated. Pt to continue ordinary activities within the limits permitted by pain. Prescribe  muscle relaxer  for pain relief.  OTC NSAIDs. Counseled patient on red flag symptoms and when to seek immediate care.  No red flags suggesting cauda equina syndrome or progressive major motor weakness. Patient to return if symptoms do not improve with conservative treatment.  Return and ED precautions given. Consider imaging at that time.      Final Clinical Impressions(s) / UC Diagnoses   Final diagnoses:  Acute bilateral low back pain without sciatica  Viral sinusitis     Discharge Instructions      For your back pain: Take 2 tablets of Tylenol 3 times a day with 2 tablets of ibuprofen as needed.  Stop by the pharmacy to pick up your muscle relaxers.  Do not drive or operate heavy machinery while using this medication (Flexeril).  We will contact you if your COVID test is positive.  Please quarantine while you wait for the results.  If your test is negative you may resume normal activities.  If your test is positive  please continue to quarantine for at least 5 days from your symptom onset or until you are without a fever for at least 24 hours after the medications.   You can take Tylenol and/or Ibuprofen as needed for fever reduction and pain relief.    For cough: honey 1/2 to 1 teaspoon (you can dilute the honey in water or another fluid).  You can also use guaifenesin and dextromethorphan for cough. You can use a humidifier for chest congestion and cough.  If you don't have a humidifier, you can sit in the bathroom with the hot shower running.      For sore throat: try warm salt water gargles, cepacol lozenges, throat spray, warm tea or water with lemon/honey, popsicles or ice, or OTC cold relief medicine for throat discomfort.    For congestion: take a daily anti-histamine like Zyrtec, Claritin, and a oral decongestant, such as pseudoephedrine.  You can also use Flonase 1-2 sprays in each nostril daily.    It is important to stay hydrated: drink plenty of fluids (water, gatorade/powerade/pedialyte, juices, or teas) to keep your throat moisturized and help further relieve irritation/discomfort.    Return or go to the Emergency Department if symptoms worsen or do not improve in the next few days      ED Prescriptions     Medication Sig Dispense Auth. Provider   cyclobenzaprine (FLEXERIL) 5 MG tablet Take 2 tablets (10 mg total) by mouth 3 (three) times daily as needed for muscle spasms. 30 tablet Katha Cabal, DO      PDMP not reviewed this encounter.   Katha Cabal, DO 10/22/21  2103  

## 2021-10-22 NOTE — ED Triage Notes (Signed)
Patient reports to UC for sinus pressure, nasal congestion -- for 3 days

## 2021-11-23 ENCOUNTER — Ambulatory Visit
Admission: EM | Admit: 2021-11-23 | Discharge: 2021-11-23 | Disposition: A | Discharge status: Home / Self Care | Payer: Medicaid Other | Attending: Emergency Medicine | Admitting: Emergency Medicine

## 2021-11-23 ENCOUNTER — Encounter: Payer: Self-pay | Admitting: Emergency Medicine

## 2021-11-23 DIAGNOSIS — U071 COVID-19: Secondary | ICD-10-CM | POA: Diagnosis present

## 2021-11-23 LAB — GROUP A STREP BY PCR: Group A Strep by PCR: NOT DETECTED

## 2021-11-23 LAB — BASIC METABOLIC PANEL
Anion gap: 8 (ref 5–15)
BUN: 11 mg/dL (ref 6–20)
CO2: 25 mmol/L (ref 22–32)
Calcium: 8.9 mg/dL (ref 8.9–10.3)
Chloride: 104 mmol/L (ref 98–111)
Creatinine, Ser: 0.81 mg/dL (ref 0.44–1.00)
GFR, Estimated: >60 mL/min (ref >60)
Glucose, Bld: 84 mg/dL (ref 70–99)
Potassium: 3.8 mmol/L (ref 3.5–5.1)
Sodium: 137 mmol/L (ref 135–145)

## 2021-11-23 LAB — RESP PANEL BY RT-PCR (FLU A&B, COVID) ARPGX2
Influenza A by PCR: NEGATIVE
Influenza B by PCR: NEGATIVE
SARS Coronavirus 2 by RT PCR: POSITIVE — AB

## 2021-11-23 MED ORDER — IBUPROFEN 600 MG PO TABS: 600.0000 mg | ORAL_TABLET | Freq: Four times a day (QID) | ORAL | 0 refills | Status: DC | PRN

## 2021-11-23 MED ORDER — AEROCHAMBER MV MISC: 1 refills | Status: DC

## 2021-11-23 MED ORDER — NIRMATRELVIR/RITONAVIR (PAXLOVID)TABLET: 3.0000 | ORAL_TABLET | Freq: Two times a day (BID) | ORAL | 0 refills | Status: AC

## 2021-11-23 MED ORDER — ALBUTEROL SULFATE HFA 108 (90 BASE) MCG/ACT IN AERS: 1.0000 | INHALATION_SPRAY | RESPIRATORY_TRACT | 0 refills | Status: DC | PRN

## 2021-11-23 MED ORDER — PROMETHAZINE-DM 6.25-15 MG/5ML PO SYRP: 5.0000 mL | ORAL_SOLUTION | Freq: Four times a day (QID) | ORAL | 0 refills | Status: DC | PRN

## 2021-11-23 NOTE — ED Triage Notes (Signed)
Pt presents with cough, runny nose, sore throat, bilateral ear pain, bodyaches and chills since yesterday.

## 2021-11-23 NOTE — ED Provider Notes (Signed)
HPI  SUBJECTIVE:  Deborah Cortez is a 21 y.o. female who presents with body aches, headaches, nasal congestion, sore throat, dry cough starting yesterday.  No fevers at home, loss of sense of smell or taste, wheezing or shortness of breath, nausea, vomiting, diarrhea, abdominal pain.  No known COVID or flu exposure.  She had 2 doses of the COVID-vaccine.  She has not yet gotten this years flu vaccine.  She has been taking DayQuil, NyQuil, Tylenol and herbal tea.  T helps with a sore throat.  Symptoms are worse when she coughs.  Last dose of Tylenol was within 6 hours of evaluation.  She has a past medical history of asthma.  She uses her albuterol as needed.  LMP: Today.  Denies possibility being pregnant.  PCP: None.    Past Medical History:  Diagnosis Date   Asthma    Orthodontics    braces    Past Surgical History:  Procedure Laterality Date   CESAREAN SECTION N/A 01/22/2019   Procedure: CESAREAN SECTION;  Surgeon: Schermerhorn, Ihor Austin, MD;  Location: [facility];  Service: Obstetrics;  Laterality: N/A;   TONSILLECTOMY     TONSILLECTOMY AND ADENOIDECTOMY N/A 03/09/2016   Procedure: TONSILLECTOMY AND ADENOIDECTOMY;  Surgeon: Geanie Logan, MD;  Location: [facility];  Service: ENT;  Laterality: N/A;    Family History  Problem Relation Age of Onset   Hyperlipidemia Mother        diet controlled   Other Father        unknown - no contact   Leukemia Maternal Grandmother     Social History   Tobacco Use   Smoking status: Never   Smokeless tobacco: Never  Vaping Use   Vaping Use: Never used  Substance Use Topics   Alcohol use: No   Drug use: Never    No current facility-administered medications for this encounter.  Current Outpatient Medications:    albuterol (VENTOLIN HFA) 108 (90 Base) MCG/ACT inhaler, Inhale 1-2 puffs into the lungs every 4 (four) hours as needed for wheezing or shortness of breath., Disp: 1 each, Rfl: 0   ibuprofen (ADVIL) 600 MG  tablet, Take 1 tablet (600 mg total) by mouth every 6 (six) hours as needed., Disp: 30 tablet, Rfl: 0   nirmatrelvir/ritonavir EUA (PAXLOVID) 20 x 150 MG & 10 x 100MG  TABS, Take 3 tablets by mouth 2 (two) times daily for 5 days. Patient GFR is >60. Take nirmatrelvir (150 mg) two tablets twice daily for 5 days and ritonavir (100 mg) one tablet twice daily for 5 days., Disp: 30 tablet, Rfl: 0   promethazine-dextromethorphan (PROMETHAZINE-DM) 6.25-15 MG/5ML syrup, Take 5 mLs by mouth 4 (four) times daily as needed for cough., Disp: 118 mL, Rfl: 0   Spacer/Aero-Holding Chambers (AEROCHAMBER MV) inhaler, Use as instructed, Disp: 1 each, Rfl: 1  No Known Allergies   ROS  As noted in HPI.   Physical Exam  BP 113/72 (BP Location: Left Arm)   Pulse 83   Temp (!) 100.8 F (38.2 C) (Oral)   Resp 16   LMP 11/23/2021   SpO2 97%   Constitutional: Well developed, well nourished, no acute distress Eyes:  EOMI, conjunctiva normal bilaterally HENT: Normocephalic, atraumatic,mucus membranes moist.  Positive nasal congestion.  No maxillary, frontal sinus tenderness.  Slightly erythematous oropharynx, tonsils without exudates. Neck: Positive cervical lymphadenopathy Respiratory: Normal inspiratory effort, lungs clear bilaterally Cardiovascular: Normal rate, regular rhythm, no murmurs, rubs, gallops GI: nondistended skin: No rash, skin intact  Musculoskeletal: no deformities Neurologic: Alert & oriented x 3, no focal neuro deficits Psychiatric: Speech and behavior appropriate   ED Course   Medications - No data to display  Orders Placed This Encounter  Procedures   Group A Strep by PCR    Standing Status:   Standing    Number of Occurrences:   1   Resp Panel by RT-PCR (Flu A&B, Covid) Anterior Nasal Swab    Standing Status:   Standing    Number of Occurrences:   1   Basic metabolic panel    Standing Status:   Standing    Number of Occurrences:   1   Nursing Communication    Standing  Status:   Standing    Number of Occurrences:   1   Airborne and Contact precautions    Standing Status:   Standing    Number of Occurrences:   1    Results for orders placed or performed during the hospital encounter of 11/23/21 (from the past 24 hour(s))  Group A Strep by PCR     Status: None   Collection Time: 11/23/21  6:20 PM   Specimen: Throat; Sterile Swab  Result Value Ref Range   Group A Strep by PCR NOT DETECTED NOT DETECTED  Resp Panel by RT-PCR (Flu A&B, Covid) Throat     Status: Abnormal   Collection Time: 11/23/21  6:20 PM   Specimen: Throat; Nasal Swab  Result Value Ref Range   SARS Coronavirus 2 by RT PCR POSITIVE (A) NEGATIVE   Influenza A by PCR NEGATIVE NEGATIVE   Influenza B by PCR NEGATIVE NEGATIVE  Basic metabolic panel     Status: None   Collection Time: 11/23/21  8:06 PM  Result Value Ref Range   Sodium 137 135 - 145 mmol/L   Potassium 3.8 3.5 - 5.1 mmol/L   Chloride 104 98 - 111 mmol/L   CO2 25 22 - 32 mmol/L   Glucose, Bld 84 70 - 99 mg/dL   BUN 11 6 - 20 mg/dL   Creatinine, Ser 0.81 0.44 - 1.00 mg/dL   Calcium 8.9 8.9 - 10.3 mg/dL   GFR, Estimated >60 >60 mL/min   Anion gap 8 5 - 15   No results found.  ED Clinical Impression  1. COVID-19 virus infection      ED Assessment/Plan     Patient COVID-positive.  She qualifies for antivirals due to to history of asthma.  Will prescribe Paxlovid.  Check a BMP.  Will call patient at [phone] if I need to change her to [facility].  Discussed with her that I would call her if and only if her labs were abnormal.  GFR above 60.  Patient with acute illness with systemic symptoms of fever.  Will send home with Tylenol/ibuprofen, Benadryl/Maalox mixture for the sore throat, Atrovent nasal spray, Mucinex D, saline nasal irrigation for the nasal congestion, postnasal drip, Promethazine DM for the cough.  Work note for 4 days.  Advised patient to get out and walk a little every day.  will also refill her  albuterol inhaler with a spacer in case she starts having wheezing, shortness of breath.  We will have staff arrange follow-up with PCP prior to discharge.  Discussed labs,  MDM, treatment plan, and plan for follow-up with patient. Discussed sn/sx that should prompt return to the ED. patient agrees with plan.   Meds ordered this encounter  Medications   albuterol (VENTOLIN HFA) 108 (90 Base) MCG/ACT inhaler  Sig: Inhale 1-2 puffs into the lungs every 4 (four) hours as needed for wheezing or shortness of breath.    Dispense:  1 each    Refill:  0   ibuprofen (ADVIL) 600 MG tablet    Sig: Take 1 tablet (600 mg total) by mouth every 6 (six) hours as needed.    Dispense:  30 tablet    Refill:  0   promethazine-dextromethorphan (PROMETHAZINE-DM) 6.25-15 MG/5ML syrup    Sig: Take 5 mLs by mouth 4 (four) times daily as needed for cough.    Dispense:  118 mL    Refill:  0   nirmatrelvir/ritonavir EUA (PAXLOVID) 20 x 150 MG & 10 x 100MG  TABS    Sig: Take 3 tablets by mouth 2 (two) times daily for 5 days. Patient GFR is >60. Take nirmatrelvir (150 mg) two tablets twice daily for 5 days and ritonavir (100 mg) one tablet twice daily for 5 days.    Dispense:  30 tablet    Refill:  0   Spacer/Aero-Holding Chambers (AEROCHAMBER MV) inhaler    Sig: Use as instructed    Dispense:  1 each    Refill:  1      *This clinic note was created using Dragon dictation software. Therefore, there may be occasional mistakes despite careful proofreading.  ?    , MD 11/24/21 1116

## 2021-11-23 NOTE — Discharge Instructions (Addendum)
I will contact you if and only if we need to change things.  You can fill the Paxlovid tomorrow.  Take 600 mg of ibuprofen with 1000 mg of Tylenol 3-4 times a day as needed for body aches, headaches, 5 mL of Benadryl mix with 5 mL of Maalox 3-4 times a day.  This will help with your sore throat.  Atrovent nasal spray, Mucinex D, saline nasal irrigation with a NeilMed sinus rinse and distilled water as often as you want for the nasal congestion.  Promethazine DM for the cough.  2 puffs from your albuterol inhaler using your spacer every 4-6 hours as needed.  Get out and walk a little bit every day to prevent blood clots.  Go to the ER for the signs and symptoms we discussed, if your oxygen is consistently below 90, if you are needing your albuterol inhaler then every 4 hours, or for any other concerns.  You will need to quarantine until Friday and wear a mask for 5 days thereafter.  Here is a list of primary care providers who are taking new patients:  [facility] [place] Dr. Rosette Reveal (sports medicine) Dr. Otilio Miu [address] [place] [address] [place] at [facility] [address] [place], [place] [address] [place] [address]  [facility] [address] [place], [place] [address] [phone]  [facility]  [phone] [place], [place] [address]   Go to www.goodrx.com  or www.costplusdrugs.com to look up your medications. This will give you a list of where you can find your prescriptions at the most affordable prices. Or ask the pharmacist what the cash price is, or if they have any other discount programs available to help make your medication more affordable. This can be less expensive than what you would pay with insurance.

## 2022-02-04 ENCOUNTER — Ambulatory Visit
Admission: EM | Admit: 2022-02-04 | Discharge: 2022-02-04 | Disposition: A | Discharge status: Home / Self Care | Payer: Medicaid Other | Attending: Emergency Medicine | Admitting: Emergency Medicine

## 2022-02-04 ENCOUNTER — Encounter: Payer: Self-pay | Admitting: Emergency Medicine

## 2022-02-04 DIAGNOSIS — J069 Acute upper respiratory infection, unspecified: Secondary | ICD-10-CM

## 2022-02-04 MED ORDER — IBUPROFEN 800 MG PO TABS: 800.0000 mg | ORAL_TABLET | Freq: Three times a day (TID) | ORAL | 0 refills | Status: DC

## 2022-02-04 MED ORDER — IPRATROPIUM BROMIDE 0.03 % NA SOLN: 2.0000 | Freq: Two times a day (BID) | NASAL | 12 refills | Status: DC

## 2022-02-04 NOTE — Discharge Instructions (Addendum)
Your symptoms today are most likely being caused by a virus and should steadily improve in time it can take up to 7 to 10 days before you truly start to see a turnaround however things will get better  No signs of infection to the irritated throat, symptoms are most likely related to nasal congestion  Begin nasal spray every morning and every evening to help clear the sinuses  You need to take a home pregnancy test as you have missed your period,  If positive do not take ibuprofen, naproxen, Aleve or any similar product, may only take Tylenol for pain    You can take Tylenol and/or Ibuprofen as needed for fever reduction and pain relief.   For cough: honey 1/2 to 1 teaspoon (you can dilute the honey in water or another fluid).  You can also use guaifenesin and dextromethorphan for cough. You can use a humidifier for chest congestion and cough.  If you don't have a humidifier, you can sit in the bathroom with the hot shower running.      For sore throat: try warm salt water gargles, cepacol lozenges, throat spray, warm tea or water with lemon/honey, popsicles or ice, or OTC cold relief medicine for throat discomfort.   For congestion: take a daily anti-histamine like Zyrtec, Claritin, and a oral decongestant, such as pseudoephedrine.  You can also use Flonase 1-2 sprays in each nostril daily.   It is important to stay hydrated: drink plenty of fluids (water, gatorade/powerade/pedialyte, juices, or teas) to keep your throat moisturized and help further relieve irritation/discomfort.  Approved OTC medications in pregnancy:  Acne: Benzoyl Peroxide Salicylic Acid  Backache/Headache: Tylenol: 2 regular strength every 4 hours OR              2 Extra strength every 6 hours  Colds/Coughs/Allergies: Benadryl (alcohol free) 25 mg every 6 hours as needed Breath right strips Claritin Cepacol throat lozenges Chloraseptic throat spray Cold-Eeze- up to three times per day Cough drops, alcohol  free Flonase (by prescription only) Guaifenesin Mucinex Robitussin DM (plain only, alcohol free) Saline nasal spray/drops Sudafed (pseudoephedrine) & Actifed * use only after [redacted] weeks gestation and if you do not have high blood pressure Tylenol Vicks Vaporub Zinc lozenges Zyrtec  Constipation: Colace Ducolax suppositories Fleet enema Glycerin suppositories Metamucil Milk of magnesia Miralax Senokot Smooth move tea  Diarrhea: Kaopectate Imodium A-D  *NO pepto Bismol  Hemorrhoids: Anusol Anusol HC Preparation H Tucks  Indigestion: Tums Maalox Mylanta Zantac Pepcid  Insomnia: Benadryl (alcohol free) 25mg  every 6 hours as needed Tylenol PM Unisom, no Gelcaps  Leg Cramps: Tums MagGel  Nausea/Vomiting: Bonine Dramamine Emetrol Ginger extract Sea bands Meclizine Nausea medication to take during pregnancy: Unisom (doxylamine succinate 25 mg tablets) Take one tablet daily at bedtime. If symptoms are not adequately controlled, the dose can be increased to a maximum recommended dose of two tablets daily (1/2 tablet in the morning, 1/2 tablet mid-afternoon and one at bedtime). Vitamin B6 100mg  tablets. Take one tablet twice a day (up to 200 mg per day).  Skin Rashes: Aveeno products Benadryl cream or 25mg  every 6 hours as needed Calamine Lotion 1% cortisone cream  Yeast infection: Gyne-lotrimin 7 Monistat 7  **If taking multiple medications, please check labels to avoid duplicating the same active ingredients **take medication as directed on the label ** Do not exceed 4000 mg of tylenol in 24 hours **Do not take medications that contain aspirin or ibuprofen

## 2022-02-04 NOTE — ED Triage Notes (Signed)
Pt presents with sore throat, sinus pressure and bilateral ear pain x 2 days.

## 2022-02-04 NOTE — ED Provider Notes (Signed)
[facility]    CSN: [id number] Arrival date & time: 02/04/22  1246      History   Chief Complaint Chief Complaint  Patient presents with   Sore Throat   Otalgia   Sinus pressure     HPI Deborah Cortez is a 21 y.o. female.   Patient presents for evaluation of nasal congestion, bilateral ear pain, rhinorrhea, sinus pain and pressure and a sore throat beginning 2 days ago.  Right-sided ear pain is worse than left.  Cough is nonproductive.  Has attempted use of Ricola cough drops which have been minimally effective.  Tolerating food and liquids.  No known sick contacts.  Denies cough, shortness of breath, wheeze, fever, chills or body aches.    Last menstrual period greater than 1 month ago.  Has not completed home testing, declined in office testing.    Past Medical History:  Diagnosis Date   Asthma    Orthodontics    braces    Patient Active Problem List   Diagnosis Date Noted   Postoperative state 01/23/2019   Spotting affecting pregnancy in third trimester 01/21/2019   Encounter for induction of labor 01/21/2019   Labor and delivery indication for care or intervention 01/21/2019    Past Surgical History:  Procedure Laterality Date   CESAREAN SECTION N/A 01/22/2019   Procedure: CESAREAN SECTION;  Surgeon: Schermerhorn, Ihor Austin, MD;  Location: [facility];  Service: Obstetrics;  Laterality: N/A;   TONSILLECTOMY     TONSILLECTOMY AND ADENOIDECTOMY N/A 03/09/2016   Procedure: TONSILLECTOMY AND ADENOIDECTOMY;  Surgeon: Geanie Logan, MD;  Location: [facility];  Service: ENT;  Laterality: N/A;    OB History     Gravida  1   Para  1   Term  1   Preterm      AB      Living  1      SAB      IAB      Ectopic      Multiple  0   Live Births  1            Home Medications    Prior to Admission medications   Medication Sig Start Date End Date Taking? Authorizing Provider  albuterol (VENTOLIN HFA) 108 (90 Base)  MCG/ACT inhaler Inhale 1-2 puffs into the lungs every 4 (four) hours as needed for wheezing or shortness of breath. 11/23/21   Domenick Gong, MD  ibuprofen (ADVIL) 600 MG tablet Take 1 tablet (600 mg total) by mouth every 6 (six) hours as needed. 11/23/21   Domenick Gong, MD  promethazine-dextromethorphan (PROMETHAZINE-DM) 6.25-15 MG/5ML syrup Take 5 mLs by mouth 4 (four) times daily as needed for cough. 11/23/21   Domenick Gong, MD  Spacer/Aero-Holding Chambers (AEROCHAMBER MV) inhaler Use as instructed 11/23/21   Domenick Gong, MD  ferrous sulfate 325 (65 FE) MG tablet Take 1 tablet (325 mg total) by mouth 2 (two) times daily with a meal. 01/25/19 07/04/19  Genia Del, CNM    Family History Family History  Problem Relation Age of Onset   Hyperlipidemia Mother        diet controlled   Other Father        unknown - no contact   Leukemia Maternal Grandmother     Social History Social History   Tobacco Use   Smoking status: Never   Smokeless tobacco: Never  Vaping Use   Vaping Use: Never used  Substance Use Topics   Alcohol  use: No   Drug use: Never     Allergies   Patient has no known allergies.   Review of Systems Review of Systems  Constitutional: Negative.   HENT:  Positive for congestion, ear pain, rhinorrhea, sinus pressure, sinus pain and sore throat. Negative for dental problem, drooling, ear discharge, facial swelling, hearing loss, mouth sores, nosebleeds, postnasal drip, sneezing, tinnitus, trouble swallowing and voice change.   Respiratory: Negative.    Cardiovascular: Negative.   Gastrointestinal: Negative.      Physical Exam Triage Vital Signs ED Triage Vitals  Enc Vitals Group     BP 02/04/22 1337 108/71     Pulse Rate 02/04/22 1337 64     Resp 02/04/22 1337 18     Temp 02/04/22 1337 98.8 F (37.1 C)     Temp Source 02/04/22 1337 Oral     SpO2 02/04/22 1337 100 %     Weight --      Height --      Head Circumference --       Peak Flow --      Pain Score 02/04/22 1336 7     Pain Loc --      Pain Edu? --      Excl. in [place]? --    No data found.  Updated Vital Signs BP 108/71 (BP Location: Left Arm)   Pulse 64   Temp 98.8 F (37.1 C) (Oral)   Resp 18   LMP  (LMP Unknown)   SpO2 100%   Visual Acuity Right Eye Distance:   Left Eye Distance:   Bilateral Distance:    Right Eye Near:   Left Eye Near:    Bilateral Near:     Physical Exam Constitutional:      Appearance: Normal appearance.  HENT:     Head: Normocephalic.     Right Ear: Tympanic membrane, ear canal and external ear normal.     Left Ear: Tympanic membrane, ear canal and external ear normal.     Nose: Congestion and rhinorrhea present.     Mouth/Throat:     Mouth: Mucous membranes are moist.     Pharynx: No posterior oropharyngeal erythema.     Tonsils: No tonsillar exudate. 0 on the right. 0 on the left.  Cardiovascular:     Rate and Rhythm: Normal rate and regular rhythm.     Pulses: Normal pulses.     Heart sounds: Normal heart sounds.  Pulmonary:     Effort: Pulmonary effort is normal.     Breath sounds: Normal breath sounds.  Skin:    General: Skin is warm and dry.  Neurological:     Mental Status: She is alert and oriented to person, place, and time. Mental status is at baseline.  Psychiatric:        Mood and Affect: Mood normal.        Behavior: Behavior normal.      UC Treatments / Results  Labs (all labs ordered are listed, but only abnormal results are displayed) Labs Reviewed - No data to display  EKG   Radiology No results found.  Procedures Procedures (including critical care time)  Medications Ordered in UC Medications - No data to display  Initial Impression / Assessment and Plan / UC Course  I have reviewed the triage vital signs and the nursing notes.  Pertinent labs & imaging results that were available during my care of the patient were reviewed by me and considered in my medical  decision  making (see chart for details).  Viral URI  No abnormalities to the oropharynx or bilateral ears, discussed with patient, symptoms are most likely related to congestion and sinus pressure, prescribed ipratropium nasal spray and ibuprofen 800 mg for outpatient use, advised patient to take home pregnancy test before use of NSAIDs and given written handout of safe over-the-counter pregnancy medicines, may follow-up with urgent care as needed, establish with gynecology and may follow-up with them if testing is positive  Final Clinical Impressions(s) / UC Diagnoses   Final diagnoses:  None   Discharge Instructions   None    ED Prescriptions   None    PDMP not reviewed this encounter.   Valinda Hoar, NP 02/04/22 1423

## 2022-02-08 NOTE — L&D Delivery Note (Signed)
Delivery Note At 6:44 AM a viable and healthy "Deborah Cortez" female was delivered via Vaginal, Spontaneous (Presentation: OA).  APGAR: see SCN documentation; weight pending .   Placenta status: Spontaneous, Intact.  Cord: 3 vessels with the following complications: None.  I was concerned with fetal tachycardia in the setting of chorio, but on my entrance into the room found her to have an anterior lip that was reducible and excellent movement with maternal effort.  Anesthesia: Epidural Episiotomy:  none Lacerations:  Right sulcal and labial repaired by me, left sulcal. Heavy bleeding from right tearing Suture Repair: 2.0 3.0 vicryl Est. Blood Loss (mL):  600  Mom to postpartum.  Baby to Couplet care / Skin to Skin.  Mackie CNM completed repair as I attended urgent surgery.  Successul VBAC!  Christeen Douglas 10/08/2022, 7:07 AM

## 2022-03-16 ENCOUNTER — Emergency Department: Payer: Medicaid Other

## 2022-03-16 ENCOUNTER — Other Ambulatory Visit: Payer: Self-pay

## 2022-03-16 ENCOUNTER — Emergency Department
Admission: EM | Admit: 2022-03-16 | Discharge: 2022-03-16 | Disposition: A | Discharge status: Home / Self Care | Payer: Medicaid Other | Attending: Student in an Organized Health Care Education/Training Program | Admitting: Student in an Organized Health Care Education/Training Program

## 2022-03-16 DIAGNOSIS — J45909 Unspecified asthma, uncomplicated: Secondary | ICD-10-CM | POA: Diagnosis not present

## 2022-03-16 DIAGNOSIS — R1013 Epigastric pain: Secondary | ICD-10-CM | POA: Diagnosis not present

## 2022-03-16 DIAGNOSIS — O26891 Other specified pregnancy related conditions, first trimester: Secondary | ICD-10-CM | POA: Diagnosis present

## 2022-03-16 DIAGNOSIS — O468X1 Other antepartum hemorrhage, first trimester: Secondary | ICD-10-CM | POA: Insufficient documentation

## 2022-03-16 DIAGNOSIS — Z3A10 10 weeks gestation of pregnancy: Secondary | ICD-10-CM | POA: Insufficient documentation

## 2022-03-16 DIAGNOSIS — O418X10 Other specified disorders of amniotic fluid and membranes, first trimester, not applicable or unspecified: Secondary | ICD-10-CM

## 2022-03-16 LAB — BASIC METABOLIC PANEL
Anion gap: 10 (ref 5–15)
BUN: 11 mg/dL (ref 6–20)
CO2: 23 mmol/L (ref 22–32)
Calcium: 9.5 mg/dL (ref 8.9–10.3)
Chloride: 103 mmol/L (ref 98–111)
Creatinine, Ser: 0.64 mg/dL (ref 0.44–1.00)
GFR, Estimated: >60 mL/min (ref >60)
Glucose, Bld: 123 mg/dL — ABNORMAL HIGH (ref 70–99)
Potassium: 3.4 mmol/L — ABNORMAL LOW (ref 3.5–5.1)
Sodium: 136 mmol/L (ref 135–145)

## 2022-03-16 LAB — CBC
HCT: 36.6 % (ref 36.0–46.0)
Hemoglobin: 12.4 g/dL (ref 12.0–15.0)
MCH: 27.4 pg (ref 26.0–34.0)
MCHC: 33.9 g/dL (ref 30.0–36.0)
MCV: 80.8 fL (ref 80.0–100.0)
Platelets: 210 10*3/uL (ref 150–400)
RBC: 4.53 MIL/uL (ref 3.87–5.11)
RDW: 13.4 % (ref 11.5–15.5)
WBC: 6.4 10*3/uL (ref 4.0–10.5)
nRBC: 0 % (ref 0.0–0.2)

## 2022-03-16 LAB — URINALYSIS, ROUTINE W REFLEX MICROSCOPIC
Bilirubin Urine: NEGATIVE
Glucose, UA: NEGATIVE mg/dL
Hgb urine dipstick: NEGATIVE
Ketones, ur: NEGATIVE mg/dL
Leukocytes,Ua: NEGATIVE
Nitrite: NEGATIVE
Protein, ur: 30 mg/dL — AB
Specific Gravity, Urine: 1.033 — ABNORMAL HIGH (ref 1.005–1.030)
pH: 5 (ref 5.0–8.0)

## 2022-03-16 LAB — TROPONIN I (HIGH SENSITIVITY): Troponin I (High Sensitivity): <2 ng/L (ref <18)

## 2022-03-16 LAB — HCG, QUANTITATIVE, PREGNANCY: hCG, Beta Chain, Quant, S: 92507 m[IU]/mL — ABNORMAL HIGH (ref <5)

## 2022-03-16 MED ORDER — OMEPRAZOLE MAGNESIUM 20 MG PO TBEC: 40.0000 mg | DELAYED_RELEASE_TABLET | Freq: Every day | ORAL | 11 refills | Status: DC

## 2022-03-16 MED ORDER — LIDOCAINE VISCOUS HCL 2 % MT SOLN
15.0000 mL | Freq: Once | OROMUCOSAL | Status: AC
  Administered 2022-03-16: 15 mL via ORAL
  Filled 2022-03-16: qty 15

## 2022-03-16 MED ORDER — ALUM & MAG HYDROXIDE-SIMETH 200-200-20 MG/5ML PO SUSP
30.0000 mL | Freq: Once | ORAL | Status: AC
  Administered 2022-03-16: 30 mL via ORAL
  Filled 2022-03-16: qty 30

## 2022-03-16 NOTE — ED Provider Notes (Signed)
[facility] Provider Note    Event Date/Time   First MD Initiated Contact with Patient 03/16/22 1856     (approximate)   History   Chief Complaint Abdominal Pain   HPI Deborah Cortez is a 22 y.o. female, [redacted] weeks pregnant, history of asthma, presents to the emergency department for evaluation of upper abdominal pain.  She states that she has been having intermittent episodes of epigastric and left upper quadrant abdominal pain over the past couple days.  In addition, she has been having mild low back pain as well.  Denies fever/chills, shortness of breath, urinary symptoms, nausea/vomiting, rashes/lesions, dizziness/lightheadedness, vaginal bleeding, vaginal discharge, or recent falls/injuries.  History Limitations: No limitations.        Physical Exam  Triage Vital Signs: ED Triage Vitals  Enc Vitals Group     BP 03/16/22 1809 120/78     Pulse Rate 03/16/22 1808 85     Resp 03/16/22 1808 17     Temp 03/16/22 1808 98.9 F (37.2 C)     Temp Source 03/16/22 1808 Oral     SpO2 03/16/22 1809 99 %     Weight 03/16/22 1854 167 lb 8.8 oz (76 kg)     Height 03/16/22 1854 5\' 3"  (1.6 m)     Head Circumference --      Peak Flow --      Pain Score 03/16/22 1810 7     Pain Loc --      Pain Edu? --      Excl. in [place]? --     Most recent vital signs: Vitals:   03/16/22 1808 03/16/22 1809  BP:  120/78  Pulse: 85   Resp: 17   Temp: 98.9 F (37.2 C)   SpO2:  99%    General: Awake, NAD.  Skin: Warm, dry. No rashes or lesions.  Eyes: PERRL. Conjunctivae normal.  CV: Good peripheral perfusion.  Resp: Normal effort.  Abd: Soft, non-tender. No distention.  Neuro: At baseline. No gross neurological deficits.  Musculoskeletal: Normal ROM of all extremities.  Focused Exam: Negative CVAT.  No midline spinal tenderness.  Physical Exam    ED Results / Procedures / Treatments  Labs (all labs ordered are listed, but only abnormal results are  displayed) Labs Reviewed  BASIC METABOLIC PANEL - Abnormal; Notable for the following components:      Result Value   Potassium 3.4 (*)    Glucose, Bld 123 (*)    All other components within normal limits  HCG, QUANTITATIVE, PREGNANCY - Abnormal; Notable for the following components:   hCG, Beta Chain, Quant, S 92,507 (*)    All other components within normal limits  URINALYSIS, ROUTINE W REFLEX MICROSCOPIC - Abnormal; Notable for the following components:   Color, Urine AMBER (*)    APPearance HAZY (*)    Specific Gravity, Urine 1.033 (*)    Protein, ur 30 (*)    Bacteria, UA RARE (*)    All other components within normal limits  CBC  TROPONIN I (HIGH SENSITIVITY)     EKG N/A.    RADIOLOGY  ED Provider Interpretation: I personally viewed and interpreted this ultrasound, small subchorionic hemorrhage present.  US OB Comp Less 14 Wks  Result Date: 03/16/2022 CLINICAL DATA:  Abdominal pain EXAM: OBSTETRIC <14 WK ULTRASOUND TECHNIQUE: Transabdominal ultrasound was performed for evaluation of the gestation as well as the maternal uterus and adnexal regions. COMPARISON:  None Available. FINDINGS: Intrauterine gestational sac:  Single Yolk sac:  Not Visualized. Embryo:  Visualized. Cardiac Activity: Visualized. Heart Rate: 160 bpm MSD:    mm    w     d CRL:   3.8 mm   10 w 5 d                  [place] EDC: 10/07/2022 Subchorionic hemorrhage: Small subchorionic hemorrhage measuring 1.5 x 2.0 x 0.9 cm. Maternal uterus/adnexae: No adnexal mass or free fluid. IMPRESSION: Ten week 5 day intrauterine pregnancy. Fetal heart rate 160 beats per minute. Small subchorionic hemorrhage. Electronically Signed   By: Rolm Baptise M.D.   On: 03/16/2022 21:16    PROCEDURES:  Critical Care performed: N/A.  Procedures    MEDICATIONS ORDERED IN ED: Medications  alum & mag hydroxide-simeth (MAALOX/MYLANTA) 200-200-20 MG/5ML suspension 30 mL (30 mLs Oral Given 03/16/22 2009)    And  lidocaine (XYLOCAINE) 2  % viscous mouth solution 15 mL (15 mLs Oral Given 03/16/22 2009)     IMPRESSION / MDM / ASSESSMENT AND PLAN / ED COURSE  I reviewed the triage vital signs and the nursing notes.                              Differential diagnosis includes, but is not limited to, subchorionic hemorrhage, GERD, ACS, arrhythmias, pneumonia, intercostal muscle strain, costochondritis.  ED Course Patient appears well, vitals within normal limits.  NAD.  She is currently endorsing some epigastric pain at this time.  Will initiate treatment with GI cocktail.  CBC shows no leukocytosis or anemia.  BMP shows no significant electrolyte abnormalities or AKI.  Initial troponin less than 2.  Urinalysis shows no evidence of infection.  Beta hCG 92,507.  Assessment/Plan Patient presents with epigastric/left upper quadrant abdominal pain x 1 day.  She appears well clinically.  No signs of any acute abdominal pathology.  Lab workup overall is reassuring.  EKG does not show any evidence of ischemia.  Treated here with a GI cocktail for suspected GERD/gastric/peptic ulcers.  She responded very well to the treatment states that she feels much better.  Her lab workup overall is reassuring.  Her ultrasound did incidentally find a small subchorionic hemorrhage, though I do not believe this is the source of her symptoms.  She does not have any significant lower abdominal pain or vaginal bleeding.  She does have some lower back pain, though this seems musculoskeletal in nature.  Advised her to follow-up with her primary care provider for reevaluation and ongoing monitoring of this hemorrhage.  She was amenable to this.  Will provide her with a prescription for omeprazole to treat for suspected GERD.  Will discharge.  Considered admission for this patient, but given her stable presentation and unremarkable workup, she is unlikely benefit from admission.  Provided the patient with anticipatory guidance, return precautions, and  educational material. Encouraged the patient to return to the emergency department at any time if they begin to experience any new or worsening symptoms. Patient expressed understanding and agreed with the plan.   Patient's presentation is most consistent with acute complicated illness / injury requiring diagnostic workup.       FINAL CLINICAL IMPRESSION(S) / ED DIAGNOSES   Final diagnoses:  Epigastric pain  Subchorionic hemorrhage of placenta in first trimester, single or unspecified fetus     Rx / DC Orders   ED Discharge Orders          Ordered  omeprazole (PRILOSEC OTC) 20 MG tablet  Daily        03/16/22 2135             Note:  This document was prepared using Dragon voice recognition software and may include unintentional dictation errors.   Teodoro Spray, [place] 03/16/22 2135    Merlyn Lot, MD 03/16/22 [phone]

## 2022-03-16 NOTE — ED Triage Notes (Addendum)
Pt presents to ED with c/o of ABD cramping, pt denies vaginal bleeding. Pt states she is about 10 weeks and states she sees [facility] for OB care. Pt also c/o of lower back and CP as well.

## 2022-03-16 NOTE — ED Notes (Signed)
See triage note  Presents with some abd cramping and lower back pain  Denies any vaginal bleeding   Pt is approx 10 pregnant

## 2022-03-16 NOTE — Discharge Instructions (Addendum)
-  I suspect that your upper abdominal pain is likely related to buildup of acid in your stomach.  Please take the omeprazole as prescribed to help manage her symptoms.  Please review the educational material and avoid any foods or beverages that may exacerbate your symptoms.  -You are found to have a very small subchorionic hematoma on your ultrasound.  However, I do not believe this is the source of your symptoms.  Please follow-up with your OB/GYN for further evaluation.  -Return to the emergency department anytime if you begin to experience any new or worsening symptoms.

## 2022-04-01 DIAGNOSIS — Z3490 Encounter for supervision of normal pregnancy, unspecified, unspecified trimester: Secondary | ICD-10-CM | POA: Insufficient documentation

## 2022-04-30 DIAGNOSIS — Z98891 History of uterine scar from previous surgery: Secondary | ICD-10-CM | POA: Insufficient documentation

## 2022-05-02 ENCOUNTER — Emergency Department: Payer: Medicaid Other

## 2022-05-02 ENCOUNTER — Emergency Department
Admission: EM | Admit: 2022-05-02 | Discharge: 2022-05-02 | Disposition: A | Discharge status: Home / Self Care | Payer: Medicaid Other | Attending: Emergency Medicine | Admitting: Emergency Medicine

## 2022-05-02 DIAGNOSIS — R079 Chest pain, unspecified: Secondary | ICD-10-CM | POA: Insufficient documentation

## 2022-05-02 DIAGNOSIS — D72829 Elevated white blood cell count, unspecified: Secondary | ICD-10-CM | POA: Insufficient documentation

## 2022-05-02 DIAGNOSIS — O99012 Anemia complicating pregnancy, second trimester: Secondary | ICD-10-CM | POA: Insufficient documentation

## 2022-05-02 DIAGNOSIS — Z3A17 17 weeks gestation of pregnancy: Secondary | ICD-10-CM | POA: Insufficient documentation

## 2022-05-02 DIAGNOSIS — J45909 Unspecified asthma, uncomplicated: Secondary | ICD-10-CM | POA: Insufficient documentation

## 2022-05-02 DIAGNOSIS — O26892 Other specified pregnancy related conditions, second trimester: Secondary | ICD-10-CM | POA: Insufficient documentation

## 2022-05-02 DIAGNOSIS — Z7951 Long term (current) use of inhaled steroids: Secondary | ICD-10-CM | POA: Diagnosis not present

## 2022-05-02 DIAGNOSIS — O99512 Diseases of the respiratory system complicating pregnancy, second trimester: Secondary | ICD-10-CM | POA: Diagnosis not present

## 2022-05-02 LAB — COMPREHENSIVE METABOLIC PANEL
ALT: 10 U/L (ref 0–44)
AST: 18 U/L (ref 15–41)
Albumin: 3.4 g/dL — ABNORMAL LOW (ref 3.5–5.0)
Alkaline Phosphatase: 51 U/L (ref 38–126)
Anion gap: 10 (ref 5–15)
BUN: 11 mg/dL (ref 6–20)
CO2: 19 mmol/L — ABNORMAL LOW (ref 22–32)
Calcium: 8.8 mg/dL — ABNORMAL LOW (ref 8.9–10.3)
Chloride: 106 mmol/L (ref 98–111)
Creatinine, Ser: 0.6 mg/dL (ref 0.44–1.00)
GFR, Estimated: >60 mL/min (ref >60)
Glucose, Bld: 96 mg/dL (ref 70–99)
Potassium: 3.6 mmol/L (ref 3.5–5.1)
Sodium: 135 mmol/L (ref 135–145)
Total Bilirubin: 0.6 mg/dL (ref 0.3–1.2)
Total Protein: 7 g/dL (ref 6.5–8.1)

## 2022-05-02 LAB — URINALYSIS, ROUTINE W REFLEX MICROSCOPIC
Bilirubin Urine: NEGATIVE
Glucose, UA: NEGATIVE mg/dL
Hgb urine dipstick: NEGATIVE
Ketones, ur: NEGATIVE mg/dL
Leukocytes,Ua: NEGATIVE
Nitrite: NEGATIVE
Protein, ur: NEGATIVE mg/dL
Specific Gravity, Urine: 1.028 (ref 1.005–1.030)
pH: 6 (ref 5.0–8.0)

## 2022-05-02 LAB — CBC WITH DIFFERENTIAL/PLATELET
Abs Immature Granulocytes: 0.07 10*3/uL (ref 0.00–0.07)
Basophils Absolute: 0 10*3/uL (ref 0.0–0.1)
Basophils Relative: 0 %
Eosinophils Absolute: 0.1 10*3/uL (ref 0.0–0.5)
Eosinophils Relative: 1 %
HCT: 32.9 % — ABNORMAL LOW (ref 36.0–46.0)
Hemoglobin: 11.2 g/dL — ABNORMAL LOW (ref 12.0–15.0)
Immature Granulocytes: 1 %
Lymphocytes Relative: 33 %
Lymphs Abs: 3.8 10*3/uL (ref 0.7–4.0)
MCH: 27.9 pg (ref 26.0–34.0)
MCHC: 34 g/dL (ref 30.0–36.0)
MCV: 81.8 fL (ref 80.0–100.0)
Monocytes Absolute: 0.7 10*3/uL (ref 0.1–1.0)
Monocytes Relative: 6 %
Neutro Abs: 7 10*3/uL (ref 1.7–7.7)
Neutrophils Relative %: 59 %
Platelets: 192 10*3/uL (ref 150–400)
RBC: 4.02 MIL/uL (ref 3.87–5.11)
RDW: 13.2 % (ref 11.5–15.5)
WBC: 11.6 10*3/uL — ABNORMAL HIGH (ref 4.0–10.5)
nRBC: 0 % (ref 0.0–0.2)

## 2022-05-02 LAB — D-DIMER, QUANTITATIVE: D-Dimer, Quant: 2 ug/mL-FEU — ABNORMAL HIGH (ref 0.00–0.50)

## 2022-05-02 LAB — LIPASE, BLOOD: Lipase: 26 U/L (ref 11–51)

## 2022-05-02 LAB — TROPONIN I (HIGH SENSITIVITY): Troponin I (High Sensitivity): 4 ng/L (ref <18)

## 2022-05-02 MED ORDER — ACETAMINOPHEN 500 MG PO TABS
1000.0000 mg | ORAL_TABLET | Freq: Once | ORAL | Status: AC
  Administered 2022-05-02: 1000 mg via ORAL
  Filled 2022-05-02: qty 2

## 2022-05-02 MED ORDER — IOHEXOL 350 MG/ML SOLN
75.0000 mL | Freq: Once | INTRAVENOUS | Status: AC | PRN
  Administered 2022-05-02: 75 mL via INTRAVENOUS

## 2022-05-02 MED ORDER — SODIUM CHLORIDE 0.9 % IV BOLUS (SEPSIS)
1000.0000 mL | Freq: Once | INTRAVENOUS | Status: AC
  Administered 2022-05-02: 1000 mL via INTRAVENOUS

## 2022-05-02 MED ORDER — CYCLOBENZAPRINE HCL 5 MG PO TABS: 5.0000 mg | ORAL_TABLET | Freq: Three times a day (TID) | ORAL | 0 refills | Status: DC | PRN

## 2022-05-02 NOTE — ED Provider Notes (Addendum)
[facility] Provider Note    Event Date/Time   First MD Initiated Contact with Patient 05/02/22 0354     (approximate)   History   Rib Injury   HPI  Deborah Cortez is a 22 y.o. female with history of asthma, G2, P1 approximately 17 weeks, 2 days pregnant who presents to the emergency department with complaints of sudden onset right rib pain, chest pain that started while getting ready for bed tonight.  States it is worse with deep inspiration and she feels short of breath due to the pain.  No fevers, cough, abdominal pain, vomiting, diarrhea, urinary symptoms.  She has not yet felt this baby move.  Denies any vaginal bleeding, leaking fluid.  No history of PE or DVT.  No calf tenderness or calf swelling.  No injury, falls.  States she used her albuterol inhaler prior to EMS arrival without relief.   History provided by patient, mother, EMS.    Past Medical History:  Diagnosis Date   Asthma    Orthodontics    braces    Past Surgical History:  Procedure Laterality Date   CESAREAN SECTION N/A 01/22/2019   Procedure: CESAREAN SECTION;  Surgeon: Schermerhorn, Gwen Her, MD;  Location: [facility] ORS;  Service: Obstetrics;  Laterality: N/A;   TONSILLECTOMY     TONSILLECTOMY AND ADENOIDECTOMY N/A 03/09/2016   Procedure: TONSILLECTOMY AND ADENOIDECTOMY;  Surgeon: Clyde Canterbury, MD;  Location: [place];  Service: ENT;  Laterality: N/A;    MEDICATIONS:  Prior to Admission medications   Medication Sig Start Date End Date Taking? Authorizing Provider  albuterol (VENTOLIN HFA) 108 (90 Base) MCG/ACT inhaler Inhale 1-2 puffs into the lungs every 4 (four) hours as needed for wheezing or shortness of breath. 11/23/21   Melynda Ripple, MD  ibuprofen (ADVIL) 800 MG tablet Take 1 tablet (800 mg total) by mouth 3 (three) times daily. 02/04/22   White, Leitha Schuller, NP  ipratropium (ATROVENT) 0.03 % nasal spray Place 2 sprays into both nostrils every 12  (twelve) hours. 02/04/22   White, Leitha Schuller, NP  omeprazole (PRILOSEC OTC) 20 MG tablet Take 2 tablets (40 mg total) by mouth daily. 03/16/22 03/16/23  Teodoro Spray, PA  promethazine-dextromethorphan (PROMETHAZINE-DM) 6.25-15 MG/5ML syrup Take 5 mLs by mouth 4 (four) times daily as needed for cough. 11/23/21   Melynda Ripple, MD  Spacer/Aero-Holding Chambers (AEROCHAMBER MV) inhaler Use as instructed 11/23/21   Melynda Ripple, MD  ferrous sulfate 325 (65 FE) MG tablet Take 1 tablet (325 mg total) by mouth 2 (two) times daily with a meal. 01/25/19 07/04/19  Lisette Grinder, CNM    Physical Exam   Triage Vital Signs: ED Triage Vitals  Enc Vitals Group     BP 05/02/22 0351 101/70     Pulse Rate 05/02/22 0351 88     Resp 05/02/22 0351 16     Temp 05/02/22 0351 97.8 F (36.6 C)     Temp Source 05/02/22 0351 Oral     SpO2 05/02/22 0351 100 %     Weight 05/02/22 0350 169 lb (76.7 kg)     Height 05/02/22 0350 5\' 3"  (1.6 m)     Head Circumference --      Peak Flow --      Pain Score 05/02/22 0349 8     Pain Loc --      Pain Edu? --      Excl. in [place]? --     Most recent  vital signs: Vitals:   05/02/22 0351  BP: 101/70  Pulse: 88  Resp: 16  Temp: 97.8 F (36.6 C)  SpO2: 100%    CONSTITUTIONAL: Alert, responds appropriately to questions.  Appears uncomfortable but not in distress, afebrile, nontoxic HEAD: Normocephalic, atraumatic EYES: Conjunctivae clear, pupils appear equal, sclera nonicteric ENT: normal nose; moist mucous membranes NECK: Supple, normal ROM CARD: RRR; S1 and S2 appreciated CHEST:  Chest wall is nontender to palpation.  No crepitus, ecchymosis, erythema, warmth, rash or other lesions present.   RESP: Normal chest excursion without splinting or tachypnea; breath sounds clear and equal bilaterally; no wheezes, no rhonchi, no rales, no hypoxia or respiratory distress, speaking full sentences ABD/GI: Non-distended; soft, non-tender, no rebound, no  guarding, no peritoneal signs, no right upper quadrant or epigastric tenderness BACK: The back appears normal EXT: Normal ROM in all joints; no deformity noted, no edema, no calf tenderness or calf swelling SKIN: Normal color for age and race; warm; no rash on exposed skin NEURO: Moves all extremities equally, normal speech PSYCH: The patient's mood and manner are appropriate.   ED Results / Procedures / Treatments   LABS: (all labs ordered are listed, but only abnormal results are displayed) Labs Reviewed  CBC WITH DIFFERENTIAL/PLATELET - Abnormal; Notable for the following components:      Result Value   WBC 11.6 (*)    Hemoglobin 11.2 (*)    HCT 32.9 (*)    All other components within normal limits  COMPREHENSIVE METABOLIC PANEL - Abnormal; Notable for the following components:   CO2 19 (*)    Calcium 8.8 (*)    Albumin 3.4 (*)    All other components within normal limits  D-DIMER, QUANTITATIVE - Abnormal; Notable for the following components:   D-Dimer, Quant 2.00 (*)    All other components within normal limits  URINALYSIS, ROUTINE W REFLEX MICROSCOPIC - Abnormal; Notable for the following components:   Color, Urine COLORLESS (*)    APPearance CLEAR (*)    All other components within normal limits  LIPASE, BLOOD  TROPONIN I (HIGH SENSITIVITY)     EKG:    Date: 05/02/2022 3:53 AM  Rate: 84  Rhythm: normal sinus rhythm  QRS Axis: normal  Intervals: normal  ST/T Wave abnormalities: normal  Conduction Disutrbances: none  Narrative Interpretation: unremarkable     RADIOLOGY: My personal review and interpretation of imaging: CT chest shows no PE.  No pneumonia.  No rib fracture.  I have personally reviewed all radiology reports.   CT Angio Chest PE W and/or Wo Contrast  Result Date: 05/02/2022 CLINICAL DATA:  Positive D-dimer with right-sided pleuritic chest pain. EXAM: CT ANGIOGRAPHY CHEST WITH CONTRAST TECHNIQUE: Multidetector CT imaging of the chest was  performed using the standard protocol during bolus administration of intravenous contrast. Multiplanar CT image reconstructions and MIPs were obtained to evaluate the vascular anatomy. RADIATION DOSE REDUCTION: This exam was performed according to the departmental dose-optimization program which includes automated exposure control, adjustment of the mA and/or kV according to patient size and/or use of iterative reconstruction technique. CONTRAST:  27mL OMNIPAQUE IOHEXOL 350 MG/ML SOLN COMPARISON:  PA chest and rib series today, PA and lateral chest 12/21/2012. FINDINGS: Cardiovascular: Satisfactory opacification of the pulmonary arteries to the segmental level. No evidence of pulmonary embolism. Normal heart size. No pericardial effusion. Mediastinum/Nodes: No enlarged mediastinal, hilar, or axillary lymph nodes. The lower poles of the thyroid gland, trachea, and esophagus demonstrate no significant findings. There is substernal residual  thymus, but no more than normal for age. Lungs/Pleura: There is a 5 mm rounded subsolid pleural-based nodule in the right middle lobe anteriorly on 5:71. The lungs are otherwise clear with mild elevation of the right hemidiaphragm. There is no pleural effusion, thickening or pneumothorax. There is mild central bronchial thickening. No bronchial plugging is seen. Upper Abdomen: No acute abnormality. Musculoskeletal: No chest wall abnormality. No acute or significant osseous findings. Review of the MIP images confirms the above findings. IMPRESSION: 1. No evidence of arterial embolus. 2. Mild central bronchial thickening which could be due to reactive airway disease or bronchitis. 3. 5 mm subsolid pleural-based nodule in the right middle lobe. No follow-up recommended. This recommendation follows the consensus statement: Guidelines for Management of Incidental Pulmonary Nodules Detected on CT Images: From the Fleischner Society 2017; Radiology 2017; 284:228-243. Electronically Signed    By: Telford Nab M.D.   On: 05/02/2022 05:42   DG Ribs Unilateral W/Chest Right  Result Date: 05/02/2022 CLINICAL DATA:  Right rib pain.  No known injury. EXAM: RIGHT RIBS AND CHEST - 3+ VIEW COMPARISON:  PA Lat 12/21/2012 FINDINGS: No displaced fracture or other bone lesions are seen involving the ribs. There is no evidence of pneumothorax or pleural effusion. Both lungs are clear. Heart size and mediastinal contours are within normal limits. IMPRESSION: Negative. Electronically Signed   By: Telford Nab M.D.   On: 05/02/2022 05:31     PROCEDURES:  Critical Care performed: No     .1-3 Lead EKG Interpretation  Performed by: Celenia Hruska, Delice Bison, DO Authorized by: Lenin Kuhnle, Delice Bison, DO     Interpretation: normal     ECG rate:  88   ECG rate assessment: normal     Rhythm: sinus rhythm     Ectopy: none     Conduction: normal       IMPRESSION / MDM / ASSESSMENT AND PLAN / ED COURSE  I reviewed the triage vital signs and the nursing notes.    Patient here with sudden onset right-sided pleuritic chest, rib pain in the setting of pregnancy.  The patient is on the cardiac monitor to evaluate for evidence of arrhythmia and/or significant heart rate changes.   DIFFERENTIAL DIAGNOSIS (includes but not limited to):   PE, rib fracture, musculoskeletal pain, pneumonia, biliary colic, doubt ACS   Patient's presentation is most consistent with acute presentation with potential threat to life or bodily function.   PLAN: Will obtain CBC, CMP, lipase, D-dimer, troponin, right rib x-rays.  Will give Tylenol for pain as at this time she does not want anything stronger.  We discussed risk of radiation exposure with x-rays and CT imaging without most of the risk would be in the first trimester and that we would shield baby with any imaging today.  She agrees to proceed.  We discussed given her pregnancy and pleuritic chest pain that she is higher risk for PE and I think it is prudent that we rule  this out today.  She has no abdominal tenderness on exam.   MEDICATIONS GIVEN IN ED: Medications  acetaminophen (TYLENOL) tablet 1,000 mg (1,000 mg Oral Given 05/02/22 0446)  sodium chloride 0.9 % bolus 1,000 mL (0 mLs Intravenous Stopped 05/02/22 0615)  iohexol (OMNIPAQUE) 350 MG/ML injection 75 mL (75 mLs Intravenous Contrast Given 05/02/22 0501)     ED COURSE: Patient has a slight leukocytosis and mild anemia which is likely due to pregnancy.  Normal electrolytes.  Normal LFTs and lipase.  Troponin negative.  D-dimer elevated.  Chest x-ray reviewed and interpreted by myself and the radiologist and is unremarkable.  Given elevated D-dimer, will obtain CTA of the chest.  Patient has been consented after discussion of risk, benefits and alternatives.   6:00 AM  Pt reports feeling better after Tylenol.  CTA of the chest reviewed and interpreted by myself and the radiologist and shows no PE.  She does have some mild central bronchial thickening likely consistent with her history of asthma.  No other acute abnormality.  Suspect this was more likely musculoskeletal in nature.  Still awaiting urinalysis and fetal heart tones.  Will p.o. challenge.  6:48 AM  Pt's fetal heart tones are normal in the 160s.  Patient tolerating p.o.  Still awaiting urine sample.    7:04 AM  Pt's urine shows no sign of infection.  Will discharge home.   At this time, I do not feel there is any life-threatening condition present. I reviewed all nursing notes, vitals, pertinent previous records.  All lab and urine results, EKGs, imaging ordered have been independently reviewed and interpreted by myself.  I reviewed all available radiology reports from any imaging ordered this visit.  Based on my assessment, I feel the patient is safe to be discharged home without further emergent workup and can continue workup as an outpatient as needed. Discussed all findings, treatment plan as well as usual and customary return precautions.   They verbalize understanding and are comfortable with this plan.  Outpatient follow-up has been provided as needed.  All questions have been answered.    CONSULTS: Anticipate discharge.   OUTSIDE RECORDS REVIEWED: Reviewed last OB/GYN note on 04/30/2022.       FINAL CLINICAL IMPRESSION(S) / ED DIAGNOSES   Final diagnoses:  Right-sided chest pain     Rx / DC Orders   ED Discharge Orders     None        Note:  This document was prepared using Dragon voice recognition software and may include unintentional dictation errors.   Hamilton Marinello, Delice Bison, DO 05/02/22 [place], Delice Bison, DO 05/02/22 [phone]

## 2022-05-02 NOTE — ED Notes (Signed)
Fetal heart tone heard, manual count 160

## 2022-05-02 NOTE — Discharge Instructions (Addendum)
You may take Tylenol 1000 mg every 6 hours as needed for pain.  Your workup today showed no sign of heart attack, blood clots, pneumonia.  Your liver and pancreas blood test were normal.  Your urine showed no sign of infection.  Your pain is likely more musculoskeletal in nature.  There is no sign of any life-threatening process present.  Please follow-up with your PCP and/or OB/GYN for continued management.  I have provided you with a prescription for Flexeril which is a muscle relaxer which is safe to take in pregnancy.

## 2022-05-02 NOTE — ED Triage Notes (Signed)
Pt presents to the ED from home via [facility] due to rib pain. Pt states she was gettign ready for bed after a family event when suddenly she had sharp pain to her upper R side. Pt states she is having some SOB. Pt is [redacted] weeks pregnant. Pt A&Ox4

## 2022-08-18 ENCOUNTER — Encounter: Payer: Medicaid Other | Attending: Obstetrics and Gynecology | Admitting: Dietician

## 2022-08-18 ENCOUNTER — Encounter: Payer: Self-pay | Admitting: Dietician

## 2022-08-18 DIAGNOSIS — O24410 Gestational diabetes mellitus in pregnancy, diet controlled: Secondary | ICD-10-CM | POA: Diagnosis present

## 2022-08-18 DIAGNOSIS — Z3A00 Weeks of gestation of pregnancy not specified: Secondary | ICD-10-CM | POA: Insufficient documentation

## 2022-08-18 DIAGNOSIS — O24419 Gestational diabetes mellitus in pregnancy, unspecified control: Secondary | ICD-10-CM

## 2022-08-18 NOTE — Progress Notes (Signed)
Patient was seen on 08/18/2022 for Gestational Diabetes self-management class at the [facility]. The following learning objectives were met by the patient during this course:  States the definition of Gestational Diabetes States why dietary management is important in controlling blood glucose Describes the effects each nutrient has on blood glucose levels Demonstrates ability to create a balanced meal plan Demonstrates carbohydrate counting  States when to check blood glucose levels Demonstrates proper blood glucose monitoring techniques States the effect of stress and exercise on blood glucose levels States the importance of limiting caffeine and abstaining from alcohol and smoking  Blood glucose monitor given: Filbert Berthold S/N: [id number] Lot # [id number] Exp: 12/21/2022 Blood glucose reading: 106 dg/mL  Patient instructed to monitor glucose levels: FBS: 60 - <90 1 hour: <140 2 hour: <120  *Patient received handouts: Nutrition Diabetes and Pregnancy Advice About Eating Fish General Nutrition Guidelines for GDM Sample Meal Planner  Patient will be seen for follow-up as needed.

## 2022-09-15 ENCOUNTER — Other Ambulatory Visit: Payer: Self-pay | Admitting: Certified Nurse Midwife

## 2022-09-15 DIAGNOSIS — O99013 Anemia complicating pregnancy, third trimester: Secondary | ICD-10-CM

## 2022-09-15 DIAGNOSIS — D509 Iron deficiency anemia, unspecified: Secondary | ICD-10-CM

## 2022-09-15 NOTE — Progress Notes (Signed)
Deborah Cortez is a 22 year old G2P1001 at [redacted]w[redacted]d with iron deficiency anemia. Hgb 8.6. Ordered IV Venofer 300mg  twice weekly for one week, then weekly until delivery.  Deborah Cortez, CNM 09/15/2022 5:03 PM

## 2022-09-20 ENCOUNTER — Ambulatory Visit
Admission: RE | Admit: 2022-09-20 | Discharge: 2022-09-20 | Disposition: A | Discharge status: Home / Self Care | Payer: Medicaid Other | Source: Ambulatory Visit | Attending: Certified Nurse Midwife | Admitting: Certified Nurse Midwife

## 2022-09-20 DIAGNOSIS — O99013 Anemia complicating pregnancy, third trimester: Secondary | ICD-10-CM | POA: Insufficient documentation

## 2022-09-20 DIAGNOSIS — Z3A37 37 weeks gestation of pregnancy: Secondary | ICD-10-CM | POA: Insufficient documentation

## 2022-09-20 DIAGNOSIS — D509 Iron deficiency anemia, unspecified: Secondary | ICD-10-CM | POA: Diagnosis not present

## 2022-09-20 MED ORDER — SODIUM CHLORIDE 0.9 % IV SOLN
300.0000 mg | INTRAVENOUS | Status: DC
  Administered 2022-09-20: 300 mg via INTRAVENOUS
  Filled 2022-09-20: qty 300

## 2022-09-22 DIAGNOSIS — O24410 Gestational diabetes mellitus in pregnancy, diet controlled: Secondary | ICD-10-CM | POA: Insufficient documentation

## 2022-09-22 DIAGNOSIS — O99013 Anemia complicating pregnancy, third trimester: Secondary | ICD-10-CM | POA: Insufficient documentation

## 2022-09-22 DIAGNOSIS — O09899 Supervision of other high risk pregnancies, unspecified trimester: Secondary | ICD-10-CM | POA: Insufficient documentation

## 2022-09-28 ENCOUNTER — Other Ambulatory Visit: Payer: Self-pay

## 2022-09-28 ENCOUNTER — Observation Stay
Admission: EM | Admit: 2022-09-28 | Discharge: 2022-09-28 | Disposition: A | Discharge status: Home / Self Care | Payer: Medicaid Other | Attending: Obstetrics and Gynecology | Admitting: Obstetrics and Gynecology

## 2022-09-28 ENCOUNTER — Encounter: Payer: Self-pay | Admitting: *Deleted

## 2022-09-28 DIAGNOSIS — Z79899 Other long term (current) drug therapy: Secondary | ICD-10-CM | POA: Insufficient documentation

## 2022-09-28 DIAGNOSIS — Z98891 History of uterine scar from previous surgery: Secondary | ICD-10-CM | POA: Diagnosis not present

## 2022-09-28 DIAGNOSIS — Z3A38 38 weeks gestation of pregnancy: Secondary | ICD-10-CM | POA: Diagnosis not present

## 2022-09-28 DIAGNOSIS — O4693 Antepartum hemorrhage, unspecified, third trimester: Secondary | ICD-10-CM | POA: Diagnosis not present

## 2022-09-28 DIAGNOSIS — O368130 Decreased fetal movements, third trimester, not applicable or unspecified: Principal | ICD-10-CM | POA: Diagnosis present

## 2022-09-28 DIAGNOSIS — O4690 Antepartum hemorrhage, unspecified, unspecified trimester: Secondary | ICD-10-CM | POA: Diagnosis present

## 2022-09-28 MED ORDER — ACETAMINOPHEN 500 MG PO TABS: 1000.0000 mg | ORAL_TABLET | Freq: Four times a day (QID) | ORAL | Status: DC | PRN

## 2022-09-28 MED ORDER — CALCIUM CARBONATE ANTACID 500 MG PO CHEW: 2.0000 | CHEWABLE_TABLET | ORAL | Status: DC | PRN

## 2022-09-28 NOTE — Discharge Summary (Cosign Needed Addendum)
Deborah Cortez is a 22 y.o. female. She is at [redacted]w[redacted]d gestation. No LMP recorded (lmp unknown). Patient is pregnant. 10/08/2022, Date entered prior to episode creation   Prenatal care site: [facility]  Chief complaint: decreased fetal movement and vaginal bleeding  Admission Diagnoses:  1) intrauterine pregnancy at [redacted]w[redacted]d  2) Decreased fetal movement affecting management of pregnancy in third trimester [O36.8130] 3) Vaginal bleeding during pregnancy  Discharge Diagnoses:  Principal Problem:   Decreased fetal movement affecting management of pregnancy in third trimester Active Problems:   Vaginal bleeding during pregnancy   HPI: Deborah Cortez presents to L&D with complaints of decreased fetal movement and vaginal bleeding.  She was seen for a routine prenatal appointment today and had a cervical exam done (1/40/-3).  She started having some spotting followed by a bloody-mucus discharge when she wipes.  Also reports that fetal movement has felt overall decreased today.  Her pregnancy is complicated by anemia, A1GDM, chlamydia in 1st trimester, rubella non-immune, and previous cesarean section .  She denies Contractions or Loss of fluid. Endorses fetal movement as  decreased today but feeling more movement now .   S: Resting comfortably. no CTX,.no LOF,  Active fetal movement.   Maternal Medical History:  Past Medical Hx:  has a past medical history of Asthma and Orthodontics.    Past Surgical Hx:  has a past surgical history that includes Tonsillectomy and adenoidectomy (N/A, 03/09/2016); Tonsillectomy; and Cesarean section (N/A, 01/22/2019).   No Known Allergies   Prior to Admission medications   Medication Sig Start Date End Date Taking? Authorizing Provider  albuterol (VENTOLIN HFA) 108 (90 Base) MCG/ACT inhaler Inhale 1-2 puffs into the lungs every 4 (four) hours as needed for wheezing or shortness of breath. 11/23/21   Domenick Gong, MD  cyclobenzaprine (FLEXERIL)  5 MG tablet Take 1 tablet (5 mg total) by mouth 3 (three) times daily as needed for muscle spasms. 05/02/22   Ward, Layla Maw, DO  ibuprofen (ADVIL) 800 MG tablet Take 1 tablet (800 mg total) by mouth 3 (three) times daily. 02/04/22   White, Elita Boone, NP  ipratropium (ATROVENT) 0.03 % nasal spray Place 2 sprays into both nostrils every 12 (twelve) hours. 02/04/22   White, Elita Boone, NP  omeprazole (PRILOSEC OTC) 20 MG tablet Take 2 tablets (40 mg total) by mouth daily. 03/16/22 03/16/23  Varney Daily, PA  Prenatal Vit-Fe Fumarate-FA (PRENATAL MULTIVITAMIN) TABS tablet Take 1 tablet by mouth daily at 12 noon.    [provider]  promethazine-dextromethorphan (PROMETHAZINE-DM) 6.25-15 MG/5ML syrup Take 5 mLs by mouth 4 (four) times daily as needed for cough. Patient not taking: Reported on 09/20/2022 11/23/21   Domenick Gong, MD  Spacer/Aero-Holding Chambers (AEROCHAMBER MV) inhaler Use as instructed 11/23/21   Domenick Gong, MD  ferrous sulfate 325 (65 FE) MG tablet Take 1 tablet (325 mg total) by mouth 2 (two) times daily with a meal. 01/25/19 07/04/19  Genia Del, CNM    Social History: She  reports that she has never smoked. She has never used smokeless tobacco. She reports that she does not drink alcohol and does not use drugs.  Family History: family history includes Hyperlipidemia in her mother; Leukemia in her maternal grandmother; Other in her father.  Review of Systems: A full review of systems was performed and negative except as noted in the HPI.     Pertinent Results:  Prenatal Labs: Blood type/Rh O POS Performed at [facility], [address] [place], [place] [address]    Antibody screen Negative    Rubella Non-immune    Varicella Immune  RPR NR    HBsAg NR   Hep C NR   HIV NR    GC neg  Chlamydia TOC and 3rd trimester neg  Genetic screening cfDNA negative   1 hour GTT 157  3 hour GTT 199, 182, 143  GBS Neg      O:  Temp 99.1  F (37.3 C)   Resp 16   LMP  (LMP Unknown)  No results found for this or any previous visit (from the past 48 hour(s)).   Constitutional: NAD, AAOx3  HE/ENT: extraocular movements grossly intact, moist mucous membranes CV: RRR PULM: nl respiratory effort Abd: gravid, non-tender, non-distended, soft  Ext: Non-tender, Nonedmeatous Psych: mood appropriate, speech normal Pelvic :  small amount of dark red to brown mucus discharge present  SVE: Dilation: 1 Effacement (%): 50 Cervical Position: Posterior Station: Ballotable    NST: Baseline FHR: 155 beats/min Variability: moderate Accelerations: present Decelerations: absent Tocometry: Irregular, mild contractions  Time: at least 20 minutes   Interpretation: Category I INDICATIONS: decreased fetal movement and rule out uterine contractions RESULTS:  A NST procedure was performed with FHR monitoring and a normal baseline established, appropriate time of 20-40 minutes of evaluation, and accels >2 seen w 15x15 characteristics.  Results show a REACTIVE NST.   Consults: None  Hospital Course: The patient was admitted to Labor and Delivery Triage for observation. She was monitored for > 1 hour.  Irregular, mild contractions were present but she did not report pain with them.  Cervical exam was unchanged from office.  Vaginal bleeding was minimal.  Reviewed warning signs to report to provider and when to return to L&D. She was deemed stable for discharge and further outpatient management.   Plan: 1) Reactive NST  -Category 1 tracing  -Reassuring fetal status   2) Labor: Not present  -Discussed warning signs to return to L&D triage with  -Reviewed comfort measures   Discharge Condition: stable  Disposition: Discharge disposition: 01-Home or Self Care      Allergies as of 09/28/2022   No Known Allergies      Medication List     STOP taking these medications    ibuprofen 800 MG tablet Commonly known as: ADVIL    ipratropium 0.03 % nasal spray Commonly known as: ATROVENT   promethazine-dextromethorphan 6.25-15 MG/5ML syrup Commonly known as: PROMETHAZINE-DM       TAKE these medications    AeroChamber MV inhaler Use as instructed   albuterol 108 (90 Base) MCG/ACT inhaler Commonly known as: VENTOLIN HFA Inhale 1-2 puffs into the lungs every 4 (four) hours as needed for wheezing or shortness of breath.   cyclobenzaprine 5 MG tablet Commonly known as: FLEXERIL Take 1 tablet (5 mg total) by mouth 3 (three) times daily as needed for muscle spasms.   omeprazole 20 MG tablet Commonly known as: PriLOSEC OTC Take 2 tablets (40 mg total) by mouth daily.   prenatal multivitamin Tabs tablet Take 1 tablet by mouth daily at 12 noon.        Follow-up Information     [facility]. Go in 1 week(s).   Why: routine prenatal appointment Contact information: [address] [place] [address] [phone]               ----- Margaretmary Eddy, CNM Certified Nurse Midwife [facility]  [facility]

## 2022-09-28 NOTE — OB Triage Note (Signed)
Pt is G2P1 at 38.4w with c/o decreased FM and vag bleeding since office visit today at 1330. Pt states she was 1 cm dilated. She has eaten dinner and had liquids and still doesn't feel much movement. Pt has not needed a pad for her bleeding and staes it happens when she wipes. Pt has GDM diet controlled. FHR 175.Marland Kitchen CNM aware of arrival

## 2022-10-01 ENCOUNTER — Ambulatory Visit
Admission: RE | Admit: 2022-10-01 | Discharge: 2022-10-01 | Disposition: A | Discharge status: Home / Self Care | Payer: Medicaid Other | Source: Ambulatory Visit | Attending: Certified Nurse Midwife | Admitting: Certified Nurse Midwife

## 2022-10-01 DIAGNOSIS — D509 Iron deficiency anemia, unspecified: Secondary | ICD-10-CM | POA: Insufficient documentation

## 2022-10-01 DIAGNOSIS — O99013 Anemia complicating pregnancy, third trimester: Secondary | ICD-10-CM | POA: Insufficient documentation

## 2022-10-01 MED ORDER — SODIUM CHLORIDE 0.9 % IV SOLN
300.0000 mg | INTRAVENOUS | Status: DC
  Administered 2022-10-01: 300 mg via INTRAVENOUS
  Filled 2022-10-01: qty 15

## 2022-10-07 ENCOUNTER — Inpatient Hospital Stay
Admission: EM | Admit: 2022-10-07 | Discharge: 2022-10-10 | DRG: 805 | Disposition: A | Discharge status: Home / Self Care | Payer: Medicaid Other | Attending: Obstetrics | Admitting: Obstetrics

## 2022-10-07 ENCOUNTER — Other Ambulatory Visit: Payer: Self-pay

## 2022-10-07 ENCOUNTER — Encounter: Payer: Self-pay | Admitting: Obstetrics and Gynecology

## 2022-10-07 DIAGNOSIS — D62 Acute posthemorrhagic anemia: Secondary | ICD-10-CM | POA: Diagnosis not present

## 2022-10-07 DIAGNOSIS — O24410 Gestational diabetes mellitus in pregnancy, diet controlled: Secondary | ICD-10-CM | POA: Diagnosis present

## 2022-10-07 DIAGNOSIS — O4690 Antepartum hemorrhage, unspecified, unspecified trimester: Principal | ICD-10-CM

## 2022-10-07 DIAGNOSIS — Z806 Family history of leukemia: Secondary | ICD-10-CM | POA: Diagnosis not present

## 2022-10-07 DIAGNOSIS — O24420 Gestational diabetes mellitus in childbirth, diet controlled: Principal | ICD-10-CM | POA: Diagnosis present

## 2022-10-07 DIAGNOSIS — Z2839 Other underimmunization status: Secondary | ICD-10-CM

## 2022-10-07 DIAGNOSIS — O09899 Supervision of other high risk pregnancies, unspecified trimester: Secondary | ICD-10-CM

## 2022-10-07 DIAGNOSIS — O4290 Premature rupture of membranes, unspecified as to length of time between rupture and onset of labor, unspecified weeks of gestation: Secondary | ICD-10-CM | POA: Diagnosis present

## 2022-10-07 DIAGNOSIS — O9952 Diseases of the respiratory system complicating childbirth: Secondary | ICD-10-CM | POA: Diagnosis present

## 2022-10-07 DIAGNOSIS — O34219 Maternal care for unspecified type scar from previous cesarean delivery: Secondary | ICD-10-CM | POA: Diagnosis present

## 2022-10-07 DIAGNOSIS — Z3A39 39 weeks gestation of pregnancy: Secondary | ICD-10-CM

## 2022-10-07 DIAGNOSIS — J45909 Unspecified asthma, uncomplicated: Secondary | ICD-10-CM | POA: Diagnosis present

## 2022-10-07 DIAGNOSIS — O411230 Chorioamnionitis, third trimester, not applicable or unspecified: Secondary | ICD-10-CM | POA: Diagnosis present

## 2022-10-07 DIAGNOSIS — O9081 Anemia of the puerperium: Secondary | ICD-10-CM | POA: Diagnosis not present

## 2022-10-07 DIAGNOSIS — O99013 Anemia complicating pregnancy, third trimester: Secondary | ICD-10-CM | POA: Diagnosis present

## 2022-10-07 DIAGNOSIS — O26893 Other specified pregnancy related conditions, third trimester: Secondary | ICD-10-CM | POA: Diagnosis present

## 2022-10-07 DIAGNOSIS — Z98891 History of uterine scar from previous surgery: Secondary | ICD-10-CM

## 2022-10-07 LAB — CBC
HCT: 31.4 % — ABNORMAL LOW (ref 36.0–46.0)
Hemoglobin: 9.9 g/dL — ABNORMAL LOW (ref 12.0–15.0)
MCH: 24 pg — ABNORMAL LOW (ref 26.0–34.0)
MCHC: 31.5 g/dL (ref 30.0–36.0)
MCV: 76.2 fL — ABNORMAL LOW (ref 80.0–100.0)
Platelets: 187 10*3/uL (ref 150–400)
RBC: 4.12 MIL/uL (ref 3.87–5.11)
RDW: 23.3 % — ABNORMAL HIGH (ref 11.5–15.5)
WBC: 9.8 10*3/uL (ref 4.0–10.5)
nRBC: 0.2 % (ref 0.0–0.2)

## 2022-10-07 LAB — WET PREP, GENITAL
Clue Cells Wet Prep HPF POC: NONE SEEN
Sperm: NONE SEEN
Trich, Wet Prep: NONE SEEN
WBC, Wet Prep HPF POC: <10 (ref <10)
Yeast Wet Prep HPF POC: NONE SEEN

## 2022-10-07 LAB — GLUCOSE, CAPILLARY: Glucose-Capillary: 107 mg/dL — ABNORMAL HIGH (ref 70–99)

## 2022-10-07 LAB — RUPTURE OF MEMBRANE (ROM)PLUS: Rom Plus: POSITIVE

## 2022-10-07 MED ORDER — ONDANSETRON HCL 4 MG/2ML IJ SOLN
4.0000 mg | Freq: Four times a day (QID) | INTRAMUSCULAR | Status: DC | PRN
  Administered 2022-10-08: 4 mg via INTRAVENOUS
  Filled 2022-10-07: qty 2

## 2022-10-07 MED ORDER — ACETAMINOPHEN 500 MG PO TABS
1000.0000 mg | ORAL_TABLET | Freq: Four times a day (QID) | ORAL | Status: DC | PRN
  Administered 2022-10-08: 1000 mg via ORAL
  Filled 2022-10-07: qty 2

## 2022-10-07 MED ORDER — FENTANYL CITRATE (PF) 100 MCG/2ML IJ SOLN: 50.0000 ug | INTRAMUSCULAR | Status: DC | PRN

## 2022-10-07 MED ORDER — EPHEDRINE 5 MG/ML INJ: 10.0000 mg | INTRAVENOUS | Status: DC | PRN

## 2022-10-07 MED ORDER — ACETAMINOPHEN 500 MG PO TABS: 1000.0000 mg | ORAL_TABLET | Freq: Four times a day (QID) | ORAL | Status: DC | PRN

## 2022-10-07 MED ORDER — PHENYLEPHRINE 80 MCG/ML (10ML) SYRINGE FOR IV PUSH (FOR BLOOD PRESSURE SUPPORT): 80.0000 ug | PREFILLED_SYRINGE | INTRAVENOUS | Status: DC | PRN

## 2022-10-07 MED ORDER — SOD CITRATE-CITRIC ACID 500-334 MG/5ML PO SOLN: 30.0000 mL | ORAL | Status: DC | PRN

## 2022-10-07 MED ORDER — LACTATED RINGERS IV SOLN: INTRAVENOUS | Status: DC

## 2022-10-07 MED ORDER — OXYTOCIN BOLUS FROM INFUSION
333.0000 mL | Freq: Once | INTRAVENOUS | Status: AC
  Administered 2022-10-08: 333 mL via INTRAVENOUS

## 2022-10-07 MED ORDER — OXYTOCIN-SODIUM CHLORIDE 30-0.9 UT/500ML-% IV SOLN
1.0000 m[IU]/min | INTRAVENOUS | Status: DC
  Filled 2022-10-07: qty 500

## 2022-10-07 MED ORDER — LACTATED RINGERS IV SOLN: 500.0000 mL | INTRAVENOUS | Status: DC | PRN

## 2022-10-07 MED ORDER — TERBUTALINE SULFATE 1 MG/ML IJ SOLN: 0.2500 mg | Freq: Once | INTRAMUSCULAR | Status: DC | PRN

## 2022-10-07 MED ORDER — DIPHENHYDRAMINE HCL 50 MG/ML IJ SOLN: 12.5000 mg | INTRAMUSCULAR | Status: DC | PRN

## 2022-10-07 MED ORDER — LACTATED RINGERS IV SOLN
500.0000 mL | Freq: Once | INTRAVENOUS | Status: AC
  Administered 2022-10-08: 500 mL via INTRAVENOUS

## 2022-10-07 MED ORDER — LIDOCAINE HCL (PF) 1 % IJ SOLN: 30.0000 mL | INTRAMUSCULAR | Status: DC | PRN

## 2022-10-07 MED ORDER — OXYTOCIN-SODIUM CHLORIDE 30-0.9 UT/500ML-% IV SOLN
2.5000 [IU]/h | INTRAVENOUS | Status: DC
  Administered 2022-10-08: 2.5 [IU]/h via INTRAVENOUS

## 2022-10-07 MED ORDER — CALCIUM CARBONATE ANTACID 500 MG PO CHEW: 2.0000 | CHEWABLE_TABLET | ORAL | Status: DC | PRN

## 2022-10-07 MED ORDER — FENTANYL-BUPIVACAINE-NACL 0.5-0.125-0.9 MG/250ML-% EP SOLN
12.0000 mL/h | EPIDURAL | Status: DC | PRN
  Administered 2022-10-08: 12 mL/h via EPIDURAL
  Filled 2022-10-07: qty 250

## 2022-10-07 NOTE — OB Triage Note (Signed)
Deborah Cortez 21 y.o. a G2P1001 @39wk6d   presents to Labor & Delivery triage via wheelchair steered by ED staff reporting leaking of fluid since 4pm this afternoon. She denies active vaginal bleeding. She reports intermittent contractions that are increasing in frequency and reports positive fetal movement. External FM and TOCO applied to non-tender abdomen. Initial FHR 150bpm. Vital signs obtained and within normal limits. Patient oriented to care environment including call bell and bed control use. Tami Lin, CNM notified of patient's arrival.

## 2022-10-07 NOTE — H&P (Signed)
OB History & Physical   History of Present Illness:   Chief Complaint: leakage of amniotic fluid   HPI:  Deborah Cortez is a 22 y.o. G2P1001 female at [redacted]w[redacted]d, Patient's last menstrual period was 12/16/2021 (exact date)., not consistent with [place] at [redacted]w[redacted]d, with Estimated Date of Delivery: 10/08/22.  She presents to L&D for leakage of fluid.  She noticed a gush of fluid starting around 1600 today.  Contractions have been irregular but have recently started to happen more frequently since about 2000.  She denies vaginal bleeding.  Endorses good fetal movement.  Her pregnancy is complicated by A1GDM, anemia in pregnancy, rubella non-immune, previous cesarean section, and chlamydia infection in first trimester.  She had a previous cesarean birth for chorioamnionitis, NRFHR, and arrest of dilation at 6 cm.  She desires a TOLAC with this pregnancy.  She has received TOLAC counseling and has a VBAC success calculated to be 51.3% ([facility]).   Reports active fetal movement  Contractions: every 3 to 6 minutes starting around 2000 LOF/SROM: clear fluid at 1600 Vaginal bleeding: denies   Factors complicating pregnancy:  Previous cesarean section  A1GDM Maternal iron deficiency anemia in pregnancy  Rubella non-immune Chlamydia infection in first trimester - TOC neg  Patient Active Problem List   Diagnosis Date Noted   Leakage of amniotic fluid 10/07/2022   Rupture of membranes with delay of delivery 10/07/2022   Anemia affecting pregnancy in third trimester 09/22/2022   Diet controlled gestational diabetes mellitus (GDM), antepartum 09/22/2022   Rubella non-immune status, antepartum 09/22/2022   Previous cesarean section 04/30/2022   Supervision of normal pregnancy 04/01/2022    Prenatal Transfer Tool  Maternal Diabetes: Yes:  Diabetes Type:  Diet controlled Genetic Screening: Normal Maternal Ultrasounds/Referrals: Normal Fetal Ultrasounds or other Referrals:  None Maternal Substance  Abuse:  No Significant Maternal Medications:  None Significant Maternal Lab Results: Group B Strep negative  Maternal Medical History:   Past Medical History:  Diagnosis Date   Asthma    Orthodontics    braces    Past Surgical History:  Procedure Laterality Date   CESAREAN SECTION N/A 01/22/2019   Procedure: CESAREAN SECTION;  Surgeon: Schermerhorn, Ihor Austin, MD;  Location: [facility];  Service: Obstetrics;  Laterality: N/A;   TONSILLECTOMY     TONSILLECTOMY AND ADENOIDECTOMY N/A 03/09/2016   Procedure: TONSILLECTOMY AND ADENOIDECTOMY;  Surgeon: Geanie Logan, MD;  Location: [facility];  Service: ENT;  Laterality: N/A;    No Known Allergies  Prior to Admission medications   Medication Sig Start Date End Date Taking? Authorizing Provider  albuterol (VENTOLIN HFA) 108 (90 Base) MCG/ACT inhaler Inhale 1-2 puffs into the lungs every 4 (four) hours as needed for wheezing or shortness of breath. 11/23/21  Yes Domenick Gong, MD  Prenatal Vit-Fe Fumarate-FA (PRENATAL MULTIVITAMIN) TABS tablet Take 1 tablet by mouth daily at 12 noon.   Yes [provider]  cyclobenzaprine (FLEXERIL) 5 MG tablet Take 1 tablet (5 mg total) by mouth 3 (three) times daily as needed for muscle spasms. Patient not taking: Reported on 10/07/2022 05/02/22   Ward, Layla Maw, DO  omeprazole (PRILOSEC OTC) 20 MG tablet Take 2 tablets (40 mg total) by mouth daily. Patient not taking: Reported on 10/07/2022 03/16/22 03/16/23  Varney Daily, PA  Spacer/Aero-Holding Chambers (AEROCHAMBER MV) inhaler Use as instructed 11/23/21   Domenick Gong, MD  ferrous sulfate 325 (65 FE) MG tablet Take 1 tablet (325 mg total) by mouth 2 (two) times daily  with a meal. 01/25/19 07/04/19  Genia Del, CNM     Prenatal care site:  [facility]  OB History  Gravida Para Term Preterm AB Living  2 1 1  0 0 1  SAB IAB Ectopic Multiple Live Births  0 0 0 0 1    # Outcome Date GA Lbr Len/2nd Weight  Sex Type Anes PTL Lv  2 Current           1 Term 01/22/19 [redacted]w[redacted]d  3380 g F CS-LTranv EPI  LIV     Name: PARADA DOMINGUEZ,GIRL Zayna     Apgar1: 4  Apgar5: 8     Social History: She  reports that she has never smoked. She has never used smokeless tobacco. She reports that she does not drink alcohol and does not use drugs.  Family History: family history includes Hyperlipidemia in her mother; Leukemia in her maternal grandmother; Other in her father.   Review of Systems: A full review of systems was performed and negative except as noted in the HPI.     Physical Exam:  Vital Signs: BP 134/80 (BP Location: Left Arm)   Pulse 90   Temp 98.4 F (36.9 C) (Oral)   Ht 5\' 3"  (1.6 m)   LMP 12/16/2021 (Exact Date)   BMI 34.19 kg/m   General: no acute distress.  HEENT: normocephalic, atraumatic Heart: regular rate & rhythm Lungs: normal respiratory effort Abdomen: soft, gravid, non-tender;  EFW: 7 1/2 lbs  Pelvic:   External: Normal external female genitalia  Cervix: Dilation: 1.5 /   / Station: -3    Extremities: non-tender, symmetric, 1+ edema bilaterally.  DTRs: 2+/2+  Neurologic: Alert & oriented x 3.    Results for orders placed or performed during the hospital encounter of 10/07/22 (from the past 24 hour(s))  Rupture of Membrane (ROM) Plus     Status: None   Collection Time: 10/07/22  9:24 PM  Result Value Ref Range   Rom Plus POSITIVE   Wet prep, genital     Status: None   Collection Time: 10/07/22  9:47 PM  Result Value Ref Range   Yeast Wet Prep HPF POC NONE SEEN NONE SEEN   Trich, Wet Prep NONE SEEN NONE SEEN   Clue Cells Wet Prep HPF POC NONE SEEN NONE SEEN   WBC, Wet Prep HPF POC <10 <10   Sperm NONE SEEN     Pertinent Results:  Prenatal Labs: Blood type/Rh O POS Performed at [facility], [address] [place], [place] [address]    Antibody screen Negative    Rubella Non-immune    Varicella Immune  RPR NR    HBsAg Neg   Hep C NR   HIV Neg     GC neg  Chlamydia neg  Genetic screening cfDNA negative/AFP neg  1 hour GTT 157  3 hour GTT 199, 182, 143   GBS Neg     FHT:  FHR: 155 bpm, variability: moderate,  accelerations:  Present,  decelerations:  Absent Category/reactivity:  Category I UC:   Irregular, mild contractions    Cephalic by Le[place] and SVE   No results found.  Assessment:  Deborah Cortez is a 22 y.o. G25P1001 female at [redacted]w[redacted]d with SROM.   Plan:  1. Admit to Labor & Delivery - consents reviewed and obtained - TOLAC risk/benefits reviewed and VBAC consent signed - Dr. Dalbert Garnet notified of admission and plan of care. Remains readily available.   2. Fetal Well  being  - Fetal Tracing: cat 1 - Group B Streptococcus ppx not indicated: GBS negative - Presentation: cephalic confirmed by SVE   3. Routine OB: - Prenatal labs reviewed, as above - Rh positive - CBC, T&S, RPR on admit - Clear liquid diet , continuous IV fluids  4. Monitoring of labor  - Contractions monitored with external toco - Pelvis adequate for trial of labor  - Plan for  augmentation with oxytocin - Plan for  continuous fetal monitoring - Maternal pain control as desired; planning regional anesthesia - Anticipate vaginal delivery  5. Post Partum Planning: - Infant feeding: expressed breast milk - Contraception:  Undecided - Flu vaccine:  Not in season - Tdap vaccine:  declined - RSV vaccine:  not in season   Gustavo Lah, [place] 10/07/22 10:43 PM  Margaretmary Eddy, CNM Certified Nurse Midwife [facility]

## 2022-10-08 ENCOUNTER — Inpatient Hospital Stay: Payer: Medicaid Other | Admitting: Anesthesiology

## 2022-10-08 ENCOUNTER — Encounter: Payer: Self-pay | Admitting: Obstetrics and Gynecology

## 2022-10-08 LAB — CBC
HCT: 25.1 % — ABNORMAL LOW (ref 36.0–46.0)
Hemoglobin: 8 g/dL — ABNORMAL LOW (ref 12.0–15.0)
MCH: 24.5 pg — ABNORMAL LOW (ref 26.0–34.0)
MCHC: 31.9 g/dL (ref 30.0–36.0)
MCV: 76.8 fL — ABNORMAL LOW (ref 80.0–100.0)
Platelets: 174 10*3/uL (ref 150–400)
RBC: 3.27 MIL/uL — ABNORMAL LOW (ref 3.87–5.11)
RDW: 23.7 % — ABNORMAL HIGH (ref 11.5–15.5)
WBC: 22 10*3/uL — ABNORMAL HIGH (ref 4.0–10.5)
nRBC: 0 % (ref 0.0–0.2)

## 2022-10-08 LAB — RPR: RPR Ser Ql: NONREACTIVE

## 2022-10-08 MED ORDER — FENTANYL CITRATE PF 50 MCG/ML IJ SOSY
50.0000 ug | PREFILLED_SYRINGE | Freq: Once | INTRAMUSCULAR | Status: AC
  Administered 2022-10-08: 100 ug via INTRAVENOUS

## 2022-10-08 MED ORDER — FERROUS SULFATE 325 (65 FE) MG PO TABS
325.0000 mg | ORAL_TABLET | Freq: Two times a day (BID) | ORAL | Status: DC
  Administered 2022-10-08 – 2022-10-10 (×4): 325 mg via ORAL
  Filled 2022-10-08 (×5): qty 1

## 2022-10-08 MED ORDER — GENTAMICIN SULFATE 40 MG/ML IJ SOLN
5.0000 mg/kg | Freq: Once | INTRAVENOUS | Status: AC
  Administered 2022-10-09: 390 mg via INTRAVENOUS
  Filled 2022-10-08: qty 9.75

## 2022-10-08 MED ORDER — SODIUM CHLORIDE 0.9% FLUSH: 3.0000 mL | INTRAVENOUS | Status: DC | PRN

## 2022-10-08 MED ORDER — PRENATAL MULTIVITAMIN CH
1.0000 | ORAL_TABLET | Freq: Every day | ORAL | Status: DC
  Administered 2022-10-08 – 2022-10-09 (×2): 1 via ORAL
  Filled 2022-10-08 (×2): qty 1

## 2022-10-08 MED ORDER — LIDOCAINE HCL (PF) 2 % IJ SOLN
INTRAMUSCULAR | Status: DC | PRN
  Administered 2022-10-08: 10 mL via INTRADERMAL

## 2022-10-08 MED ORDER — WITCH HAZEL-GLYCERIN EX PADS
1.0000 | MEDICATED_PAD | CUTANEOUS | Status: DC | PRN
  Administered 2022-10-09: 1 via TOPICAL
  Filled 2022-10-08 (×3): qty 100

## 2022-10-08 MED ORDER — OXYTOCIN 10 UNIT/ML IJ SOLN
INTRAMUSCULAR | Status: AC
  Filled 2022-10-08: qty 2

## 2022-10-08 MED ORDER — SODIUM CHLORIDE 0.9 % IV SOLN
300.0000 mg | Freq: Once | INTRAVENOUS | Status: AC
  Administered 2022-10-08: 300 mg via INTRAVENOUS
  Filled 2022-10-08: qty 300

## 2022-10-08 MED ORDER — LIDOCAINE HCL (PF) 1 % IJ SOLN
INTRAMUSCULAR | Status: AC
  Filled 2022-10-08: qty 30

## 2022-10-08 MED ORDER — MISOPROSTOL 200 MCG PO TABS
ORAL_TABLET | ORAL | Status: AC
  Filled 2022-10-08: qty 4

## 2022-10-08 MED ORDER — ZOLPIDEM TARTRATE 5 MG PO TABS: 5.0000 mg | ORAL_TABLET | Freq: Every evening | ORAL | Status: DC | PRN

## 2022-10-08 MED ORDER — SENNOSIDES-DOCUSATE SODIUM 8.6-50 MG PO TABS
2.0000 | ORAL_TABLET | Freq: Every day | ORAL | Status: DC
  Administered 2022-10-09 – 2022-10-10 (×2): 2 via ORAL
  Filled 2022-10-08 (×2): qty 2

## 2022-10-08 MED ORDER — SIMETHICONE 80 MG PO CHEW: 80.0000 mg | CHEWABLE_TABLET | ORAL | Status: DC | PRN

## 2022-10-08 MED ORDER — LIDOCAINE-EPINEPHRINE (PF) 1.5 %-1:200000 IJ SOLN
INTRAMUSCULAR | Status: DC | PRN
  Administered 2022-10-08: 3 mL via EPIDURAL

## 2022-10-08 MED ORDER — ACETAMINOPHEN 500 MG PO TABS
1000.0000 mg | ORAL_TABLET | Freq: Four times a day (QID) | ORAL | Status: DC
  Administered 2022-10-08 – 2022-10-10 (×7): 1000 mg via ORAL
  Filled 2022-10-08 (×8): qty 2

## 2022-10-08 MED ORDER — SODIUM CHLORIDE 0.9 % IV SOLN
2.0000 g | Freq: Four times a day (QID) | INTRAVENOUS | Status: AC
  Administered 2022-10-08 – 2022-10-09 (×4): 2 g via INTRAVENOUS
  Filled 2022-10-08 (×5): qty 2000

## 2022-10-08 MED ORDER — AMMONIA AROMATIC IN INHA
RESPIRATORY_TRACT | Status: AC
  Filled 2022-10-08: qty 10

## 2022-10-08 MED ORDER — DIPHENHYDRAMINE HCL 25 MG PO CAPS: 25.0000 mg | ORAL_CAPSULE | Freq: Four times a day (QID) | ORAL | Status: DC | PRN

## 2022-10-08 MED ORDER — GENTAMICIN SULFATE 40 MG/ML IJ SOLN
5.0000 mg/kg | INTRAVENOUS | Status: DC
  Administered 2022-10-08: 390 mg via INTRAVENOUS
  Filled 2022-10-08: qty 9.75

## 2022-10-08 MED ORDER — LACTATED RINGERS IV BOLUS
1000.0000 mL | Freq: Once | INTRAVENOUS | Status: AC
  Administered 2022-10-08: 1000 mL via INTRAVENOUS

## 2022-10-08 MED ORDER — FENTANYL CITRATE PF 50 MCG/ML IJ SOSY
PREFILLED_SYRINGE | INTRAMUSCULAR | Status: AC
  Filled 2022-10-08: qty 2

## 2022-10-08 MED ORDER — ONDANSETRON HCL 4 MG PO TABS: 4.0000 mg | ORAL_TABLET | ORAL | Status: DC | PRN

## 2022-10-08 MED ORDER — COCONUT OIL OIL
1.0000 | TOPICAL_OIL | Status: DC | PRN
  Filled 2022-10-08: qty 7.5

## 2022-10-08 MED ORDER — SODIUM CHLORIDE 0.9% FLUSH
3.0000 mL | Freq: Two times a day (BID) | INTRAVENOUS | Status: DC
  Administered 2022-10-08 – 2022-10-09 (×3): 3 mL via INTRAVENOUS

## 2022-10-08 MED ORDER — SODIUM CHLORIDE 0.9 % IV SOLN: 250.0000 mL | INTRAVENOUS | Status: DC | PRN

## 2022-10-08 MED ORDER — BUPIVACAINE HCL (PF) 0.25 % IJ SOLN
INTRAMUSCULAR | Status: DC | PRN
  Administered 2022-10-08 (×2): 8 mL via EPIDURAL

## 2022-10-08 MED ORDER — DIBUCAINE (PERIANAL) 1 % EX OINT: 1.0000 | TOPICAL_OINTMENT | CUTANEOUS | Status: DC | PRN

## 2022-10-08 MED ORDER — IBUPROFEN 600 MG PO TABS
600.0000 mg | ORAL_TABLET | Freq: Four times a day (QID) | ORAL | Status: DC
  Administered 2022-10-08 – 2022-10-10 (×9): 600 mg via ORAL
  Filled 2022-10-08 (×8): qty 1

## 2022-10-08 MED ORDER — SODIUM CHLORIDE 0.9 % IV SOLN
2.0000 g | Freq: Four times a day (QID) | INTRAVENOUS | Status: DC
  Administered 2022-10-08: 2 g via INTRAVENOUS
  Filled 2022-10-08: qty 2000

## 2022-10-08 MED ORDER — BENZOCAINE-MENTHOL 20-0.5 % EX AERO
1.0000 | INHALATION_SPRAY | CUTANEOUS | Status: DC | PRN
  Filled 2022-10-08 (×3): qty 56

## 2022-10-08 MED ORDER — ONDANSETRON HCL 4 MG/2ML IJ SOLN: 4.0000 mg | INTRAMUSCULAR | Status: DC | PRN

## 2022-10-08 NOTE — Discharge Instructions (Signed)
Vaginal Delivery, Care After Refer to this sheet in the next few weeks. These discharge instructions provide you with information on caring for yourself after delivery. Your caregiver may also give you specific instructions. Your treatment has been planned according to the most current medical practices available, but problems sometimes occur. Call your caregiver if you have any problems or questions after you go home. HOME CARE INSTRUCTIONS Take over-the-counter or prescription medicines only as directed by your caregiver or pharmacist. Do not drink alcohol, especially if you are breastfeeding or taking medicine to relieve pain. Do not smoke tobacco. Continue to use good perineal care. Good perineal care includes: Wiping your perineum from back to front Keeping your perineum clean. You can do sitz baths twice a day, to help keep this area clean Do not use tampons, douche or have sex until your caregiver says it is okay. Shower only and avoid sitting in submerged water, aside from sitz baths Wear a well-fitting bra that provides breast support. Eat healthy foods. Drink enough fluids to keep your urine clear or pale yellow. Eat high-fiber foods such as whole grain cereals and breads, brown rice, beans, and fresh fruits and vegetables every day. These foods may help prevent or relieve constipation. Avoid constipation with high fiber foods or medications, such as miralax or metamucil Follow your caregiver's recommendations regarding resumption of activities such as climbing stairs, driving, lifting, exercising, or traveling. Talk to your caregiver about resuming sexual activities. Resumption of sexual activities is dependent upon your risk of infection, your rate of healing, and your comfort and desire to resume sexual activity. Try to have someone help you with your household activities and your newborn for at least a few days after you leave the hospital. Rest as much as possible. Try to rest or  take a nap when your newborn is sleeping. Increase your activities gradually. Keep all of your scheduled postpartum appointments. It is very important to keep your scheduled follow-up appointments. At these appointments, your caregiver will be checking to make sure that you are healing physically and emotionally. SEEK MEDICAL CARE IF:  You are passing large clots from your vagina. Save any clots to show your caregiver. You have a foul smelling discharge from your vagina. You have trouble urinating. You are urinating frequently. You have pain when you urinate. You have a change in your bowel movements. You have increasing redness, pain, or swelling near your vaginal incision (episiotomy) or vaginal tear. You have pus draining from your episiotomy or vaginal tear. Your episiotomy or vaginal tear is separating. You have painful, hard, or reddened breasts. You have a severe headache. You have blurred vision or see spots. You feel sad or depressed. You have thoughts of hurting yourself or your newborn. You have questions about your care, the care of your newborn, or medicines. You are dizzy or light-headed. You have a rash. You have nausea or vomiting. You were breastfeeding and have not had a menstrual period within 12 weeks after you stopped breastfeeding. You are not breastfeeding and have not had a menstrual period by the 12th week after delivery. You have a fever. SEEK IMMEDIATE MEDICAL CARE IF:  You have persistent pain. You have chest pain. You have shortness of breath. You faint. You have leg pain. You have stomach pain. Your vaginal bleeding saturates two or more sanitary pads in 1 hour. MAKE SURE YOU:  Understand these instructions. Will watch your condition. Will get help right away if you are not doing well or   get worse. Document Released: 01/23/2000 Document Revised: 06/11/2013 Document Reviewed: 09/22/2011 ExitCare Patient Information 2015 [employer]. This  information is not intended to replace advice given to you by your health care provider. Make sure you discuss any questions you have with your health care provider.  Sitz Bath A sitz bath is a warm water bath taken in the sitting position. The water covers only the hips and butt (buttocks). We recommend using one that fits in the toilet, to help with ease of use and cleanliness. It may be used for either healing or cleaning purposes. Sitz baths are also used to relieve pain, itching, or muscle tightening (spasms). The water may contain medicine. Moist heat will help you heal and relax.  HOME CARE  Take 3 to 4 sitz baths a day. Fill the bathtub half-full with warm water. Sit in the water and open the drain a little. Turn on the warm water to keep the tub half-full. Keep the water running constantly. Soak in the water for 15 to 20 minutes. After the sitz bath, pat the affected area dry. GET HELP RIGHT AWAY IF: You get worse instead of better. Stop the sitz baths if you get worse. MAKE SURE YOU: Understand these instructions. Will watch your condition. Will get help right away if you are not doing well or get worse. Document Released: 03/04/2004 Document Revised: 10/20/2011 Document Reviewed: 05/25/2010 ExitCare Patient Information 2015 [employer]. This information is not intended to replace advice given to you by your health care provider. Make sure you discuss any questions you have with your health care provider.   

## 2022-10-08 NOTE — Progress Notes (Signed)
Monitors temporarily removed for epidural placement 0124.

## 2022-10-08 NOTE — Progress Notes (Signed)
L&D Note    Subjective:  Notified about temp of 38.4.  Starting to feel more contractions through epidural.  Objective:   Vitals:   10/08/22 0215 10/08/22 0230 10/08/22 0353 10/08/22 0540  BP: 110/75 119/64    Pulse: 99 75    Resp:      Temp: 98.8 F (37.1 C)  99.5 F (37.5 C) (!) 101.1 F (38.4 C)  TempSrc: Oral  Oral Oral  SpO2:  100%    Weight:      Height:        Current Vital Signs 24h Vital Sign Ranges  T (!) 101.1 F (38.4 C) Temp  Avg: 99.5 F (37.5 C)  Min: 98.4 F (36.9 C)  Max: 101.1 F (38.4 C)  BP 119/64 BP  Min: 110/75  Max: 134/74  HR 75 Pulse  Avg: 88.9  Min: 75  Max: 110  RR 16 Resp  Avg: 16  Min: 16  Max: 16  SaO2 100 %   SpO2  Avg: 100 %  Min: 100 %  Max: 100 %      Gen: alert, cooperative, no distress FHR: Baseline: 155 bpm -> 165 bpm, Variability: moderate, Accels: Present, Decels: none Toco: regular, every 2-3 minutes SVE: Dilation: 8 Effacement (%): 90 Cervical Position: Anterior Station: -1, 0 Exam by:: T. Allen,RN  Medications SCHEDULED MEDICATIONS   oxytocin 40 units in LR 1000 mL  333 mL Intravenous Once    MEDICATION INFUSIONS   ampicillin (OMNIPEN) IV     fentaNYL 2 mcg/mL w/bupivacaine 0.125% in NS 250 mL 12 mL/hr (10/08/22 0139)   gentamicin     lactated ringers     lactated ringers 125 mL/hr at 10/08/22 0321   oxytocin     oxytocin      PRN MEDICATIONS  acetaminophen, calcium carbonate, diphenhydrAMINE, ePHEDrine, ePHEDrine, fentaNYL (SUBLIMAZE) injection, fentaNYL 2 mcg/mL w/bupivacaine 0.125% in NS 250 mL, lactated ringers, lidocaine (PF), ondansetron, phenylephrine, phenylephrine, sodium citrate-citric acid, terbutaline   Assessment & Plan:  22 y.o. G2P1001 at [redacted]w[redacted]d admitted for SROM -Labor: Active phase labor. -Fetal Well-being: Category I->II -GBS: negative -Membranes ruptured, clear fluid, spontaneously at 1600 -Chorioamnionitis - maternal temp now 38.4.  Previously 38.1 at 0145 followed by 37.1 and 37.5.  Recent  change in baseline from 155 to 165 bpm.  Will give Tylenol and LR IVF bolus now.  Ampicillin and gentamicin ordered.  She has continued to make appropriate cervical change without augmentation.  -Continue present management. Plan to augment with oxytocin if contractions decrease in frequency or if labor progress is effected  -Analgesia: regional anesthesia   Gustavo Lah, CNM  10/08/2022 5:45 AM  [facility]

## 2022-10-08 NOTE — Anesthesia Preprocedure Evaluation (Addendum)
Anesthesia Evaluation  Patient identified by MRN, date of birth, ID band Patient awake    Reviewed: Allergy & Precautions, NPO status , Patient's Chart, lab work & pertinent test results  History of Anesthesia Complications Negative for: history of anesthetic complications  Airway Mallampati: III   Neck ROM: Full    Dental   Pulmonary asthma    Pulmonary exam normal breath sounds clear to auscultation       Cardiovascular Exercise Tolerance: Good negative cardio ROS Normal cardiovascular exam Rhythm:Regular Rate:Normal     Neuro/Psych negative neurological ROS     GI/Hepatic negative GI ROS,,,  Endo/Other  diabetes, Gestational    Renal/GU negative Renal ROS     Musculoskeletal   Abdominal   Peds  Hematology  (+) Blood dyscrasia, anemia   Anesthesia Other Findings 22 yo G2P1001 at 105 0/7 presenting for TOLAC and requesting labor epidural.  Reproductive/Obstetrics                             Anesthesia Physical Anesthesia Plan  ASA: 2  Anesthesia Plan: Epidural   Post-op Pain Management:    Induction:   PONV Risk Score and Plan: 2 and Treatment may vary due to age or medical condition  Airway Management Planned: Natural Airway  Additional Equipment:   Intra-op Plan:   Post-operative Plan:   Informed Consent: I have reviewed the patients History and Physical, chart, labs and discussed the procedure including the risks, benefits and alternatives for the proposed anesthesia with the patient or authorized representative who has indicated his/her understanding and acceptance.     Dental Advisory Given  Plan Discussed with:   Anesthesia Plan Comments: (Patient reports no bleeding problems and no anticoagulant use.   Patient consented for risks of anesthesia including but not limited to:  - adverse reactions to medications - risk of bleeding, infection and or nerve damage  from epidural that could lead to paralysis - risk of headache or failed epidural - nerve damage due to positioning - that if epidural is used for C-section that there is a chance of epidural failure requiring spinal placement or conversion to GA - damage to heart, brain, lungs, other parts of body or loss of life  Patient voiced understanding.)        Anesthesia Quick Evaluation

## 2022-10-08 NOTE — Lactation Note (Signed)
This note was copied from a baby's chart. Lactation Consultation Note  Patient Name: Girl Eldon Santizo [id number] Date: 10/08/2022 Age:22 hours Reason for consult: Initial assessment;1st time breastfeeding  Lactation to the room for initial visit. Mother is holding the baby on her chest. She is wanting assistance with latch. Mother states she did not BF her first child, they would not latch. Baby was positioned in football on the left, taught hand expression. Drops expressed into baby's mouth, then taught to stroke nipple nose to chin and wait for baby to open wide. Baby was latch but quickly pulling off the breast for several attempts. LC encouraged Mother to burp baby, she burped and was placed back to the breast. Baby then was able to latch and maintain with rhythmic sucking pattern and many swallows. Encouraged feeding on demand and with cues. If baby is not cueing encouraged hand expression and skin to skin. Encouraged 8 or more attempts in the first 24 hours and 8 or more good feeds after 24 HOL. Reviewed appropriate diapers for days of life and How to know your baby is getting enough to eat. Reviewed "Understanding Postpartum and Newborn Care" booklet at bedside. [facility] # left on board, encouraged to call for any assistance. Mother has no further questions at this time.   Maternal Data Has patient been taught Hand Expression?: Yes Does the patient have breastfeeding experience prior to this delivery?: No  Feeding Mother's Current Feeding Choice: Breast Milk  LATCH Score Latch: Repeated attempts needed to sustain latch, nipple held in mouth throughout feeding, stimulation needed to elicit sucking reflex.  Audible Swallowing: Spontaneous and intermittent  Type of Nipple: Everted at rest and after stimulation  Comfort (Breast/Nipple): Soft / non-tender  Hold (Positioning): Assistance needed to correctly position infant at breast and maintain latch.  LATCH Score: 8   Lactation  Tools Discussed/Used    Interventions Interventions: Breast feeding basics reviewed;Assisted with latch;Adjust position;Support pillows;Position options;Expressed milk;Education  Discharge Pump:  (none)  Consult Status Consult Status: Follow-up    Jozelyn Kuwahara D Farrell Pantaleo 10/08/2022, 2:10 PM

## 2022-10-08 NOTE — Anesthesia Procedure Notes (Signed)
Epidural Patient location during procedure: OB Start time: 10/08/2022 1:27 AM End time: 10/08/2022 1:36 AM  Staffing Anesthesiologist: Reed Breech, MD Performed: anesthesiologist   Preanesthetic Checklist Completed: patient identified, IV checked, risks and benefits discussed, surgical consent, monitors and equipment checked, pre-op evaluation and timeout performed  Epidural Patient position: sitting Prep: Betadine Patient monitoring: heart rate, continuous pulse ox and blood pressure Approach: midline Location: L3-L4 Injection technique: LOR air  Needle:  Needle type: Tuohy  Needle gauge: 17 G Needle length: 9 cm Needle insertion depth: 4.5 cm Catheter at skin depth: 9.5 cm Test dose: negative and 1.5% lidocaine with Epi 1:200 K  Assessment Sensory level: T4  Additional Notes Straightforward placement without apparent complications. Reason for block:procedure for pain

## 2022-10-08 NOTE — Progress Notes (Signed)
Patient moved from [facility] to [facility] for admission.

## 2022-10-08 NOTE — Discharge Summary (Addendum)
Postpartum Discharge Summary  Patient Name: Deborah Cortez DOB: Mar 16, 2000 MRN: [id number]  Date of admission: 10/07/2022 Delivery date:10/08/2022 Delivering provider: Christeen Douglas Date of discharge: 10/10/2022  Primary OB: [facility] WUJ:[id number] last menstrual period was 12/16/2021 (exact date). EDC Estimated Date of Delivery: 10/08/22 Gestational Age at Delivery: [redacted]w[redacted]d   Antepartum complications:  Previous cesarean section  A1GDM Maternal iron deficiency anemia in pregnancy  Rubella non-immune Chlamydia infection in first trimester - TOC neg  Admitting diagnosis: Leakage of amniotic fluid [O42.90] Rupture of membranes with delay of delivery [O42.90] Intrauterine pregnancy: [redacted]w[redacted]d     Secondary diagnosis:  Principal Problem:   Leakage of amniotic fluid Active Problems:   Anemia affecting pregnancy in third trimester   Diet controlled gestational diabetes mellitus (GDM), antepartum   Previous cesarean section   Rubella non-immune status, antepartum   Rupture of membranes with delay of delivery  Additional problems: postpartum hemorrhage requiring 1u PRBCs   Discharge Diagnosis: Term Pregnancy Delivered, VBAC, and GDM A1                                                Post partum procedures:blood transfusion Augmentation: N/A Complications: Intrauterine Inflammation or infection (Chorioamniotis) Delivery Type: vaginal birth after cesarean (VBAC) Anesthesia: epidural anesthesia Placenta: spontaneous with trailing membranes To Pathology: No  Laceration: bilateral sulcal with labial extension  Episiotomy: none  Prenatal Labs:  Blood type/Rh O POS Performed at [facility], [address] [place], [place] [address]    Antibody screen Negative    Rubella Non-immune    Varicella Immune  RPR NR    HBsAg Neg   Hep C NR   HIV Neg    GC neg  Chlamydia neg  Genetic screening cfDNA negative/AFP neg  1 hour GTT 157  3 hour GTT 199, 182,  143   GBS Neg      Postpartum Procedures: transfusion 1u PRBCs Edinburgh:     10/09/2022    8:22 AM 01/23/2019    4:30 PM 01/23/2019    8:45 AM  Edinburgh Postnatal Depression Scale Screening Tool  I have been able to laugh and see the funny side of things. 0 0 --  I have looked forward with enjoyment to things. 0 0   I have blamed myself unnecessarily when things went wrong. 0 0   I have been anxious or worried for no good reason. 0 0   I have felt scared or panicky for no good reason. 0 0   Things have been getting on top of me. 0 0   I have been so unhappy that I have had difficulty sleeping. 0 0   I have felt sad or miserable. 0 0   I have been so unhappy that I have been crying. 0 0   The thought of harming myself has occurred to me. 0 0   Edinburgh Postnatal Depression Scale Total 0 0      Hospital course: Onset of Labor With Vaginal Delivery      22 y.o. yo [id number] at [redacted]w[redacted]d was admitted in Latent Labor after SROM on 10/07/2022. Labor course was complicated by chorioamnionitis immediately prior to 2nd stage of labor.  She pushed quickly over approximately 15 minutes for a vaginal birth after cesarean section.   Membrane Rupture Time/Date: 6:30 PM,10/08/2022  Delivery Method:Vaginal, Spontaneous Operative Delivery:N/A  Episiotomy: None Lacerations:  Sulcus;Labial Patient had a postpartum course complicated by acute blood loss anemia.  She received ampicillin and gentamicin for 24 hours postpartum. She received 1u PRBCs at 1 day postpartum. She is ambulating, tolerating a regular diet, passing flatus, and urinating well. Patient is discharged home in stable condition on 10/10/22.  Newborn Data: Birth date:10/08/2022 Birth time:6:44 AM Gender:Female Living status:Living Apgars:8 ,9  Weight:3320 g  Magnesium Sulfate received: No BMZ received: No Rhophylac:N/A MMR:N/A Varivax vaccine given: was not indicated T-DaP: declined Flu: N/A  Transfusion:Yes  Physical exam   Vitals:   10/09/22 2000 10/09/22 2325 10/10/22 0423 10/10/22 0724  BP: 126/71 115/65 108/70 99/65  Pulse: 72 80 77 70  Resp: 20 18 18 18   Temp: 98.6 F (37 C) 98.1 F (36.7 C) 98.3 F (36.8 C) 98.1 F (36.7 C)  TempSrc: Oral Oral Oral Oral  SpO2: 100% 100% 100% 100%  Weight:      Height:       General: alert, cooperative, and no distress Lochia: appropriate Uterine Fundus: firm Perineum: minimal edema/repair well approximated DVT Evaluation: No evidence of DVT seen on physical exam.  Labs: Lab Results  Component Value Date   WBC 13.0 (H) 10/09/2022   HGB 8.3 (L) 10/09/2022   HCT 25.0 (L) 10/09/2022   MCV 77.6 (L) 10/09/2022   PLT 146 (L) 10/09/2022      Latest Ref Rng & Units 05/02/2022    3:57 AM  CMP  Glucose 70 - 99 mg/dL 96   BUN 6 - 20 mg/dL 11   Creatinine 8.65 - 1.00 mg/dL 7.84   Sodium 696 - 295 mmol/L 135   Potassium 3.5 - 5.1 mmol/L 3.6   Chloride 98 - 111 mmol/L 106   CO2 22 - 32 mmol/L 19   Calcium 8.9 - 10.3 mg/dL 8.8   Total Protein 6.5 - 8.1 g/dL 7.0   Total Bilirubin 0.3 - 1.2 mg/dL 0.6   Alkaline Phos 38 - 126 U/L 51   AST 15 - 41 U/L 18   ALT 0 - 44 U/L 10    Edinburgh Score:    10/09/2022    8:22 AM  Edinburgh Postnatal Depression Scale Screening Tool  I have been able to laugh and see the funny side of things. 0  I have looked forward with enjoyment to things. 0  I have blamed myself unnecessarily when things went wrong. 0  I have been anxious or worried for no good reason. 0  I have felt scared or panicky for no good reason. 0  Things have been getting on top of me. 0  I have been so unhappy that I have had difficulty sleeping. 0  I have felt sad or miserable. 0  I have been so unhappy that I have been crying. 0  The thought of harming myself has occurred to me. 0  Edinburgh Postnatal Depression Scale Total 0    Risk assessment for postpartum VTE and prophylactic treatment: Very high risk factors: None High risk factors:  None Moderate risk factors: None  Postpartum VTE prophylaxis with LMWH not indicated  After visit meds:  Allergies as of 10/10/2022   No Known Allergies      Medication List     TAKE these medications    acetaminophen 500 MG tablet Commonly known as: TYLENOL Take 2 tablets (1,000 mg total) by mouth every 6 (six) hours as needed for fever or mild pain.   AeroChamber MV inhaler Use as instructed  albuterol 108 (90 Base) MCG/ACT inhaler Commonly known as: VENTOLIN HFA Inhale 1-2 puffs into the lungs every 4 (four) hours as needed for wheezing or shortness of breath.   ferrous sulfate 325 (65 FE) MG tablet Take 1 tablet (325 mg total) by mouth 2 (two) times daily with a meal.   ibuprofen 600 MG tablet Commonly known as: ADVIL Take 1 tablet (600 mg total) by mouth every 6 (six) hours as needed for cramping or mild pain.   prenatal multivitamin Tabs tablet Take 1 tablet by mouth daily at 12 noon.       Discharge home in stable condition Infant Feeding: Breast Infant Disposition:home with mother Discharge instruction: per After Visit Summary and Postpartum booklet. Activity: Advance as tolerated. Pelvic rest for 6 weeks.  Diet: routine diet Anticipated Birth Control: Unsure Postpartum Appointment:6 weeks Additional Postpartum F/U:  none Future Appointments:No future appointments. Follow up Visit:  Follow-up Information     Christeen Douglas, MD. Schedule an appointment as soon as possible for a visit in 6 week(s).   Specialty: Obstetrics and Gynecology Why: postpartum visit Contact information: [address] [place] [address] [phone]                 Plan:  Tabia Jamrog was discharged to home in good condition. Follow-up appointment as directed.    Signed:  Janyce Llanos, CNM 10/10/2022 8:48 AM

## 2022-10-09 ENCOUNTER — Encounter: Payer: Self-pay | Admitting: Obstetrics and Gynecology

## 2022-10-09 LAB — CBC
HCT: 21.2 % — ABNORMAL LOW (ref 36.0–46.0)
HCT: 25 % — ABNORMAL LOW (ref 36.0–46.0)
Hemoglobin: 6.5 g/dL — ABNORMAL LOW (ref 12.0–15.0)
Hemoglobin: 8.3 g/dL — ABNORMAL LOW (ref 12.0–15.0)
MCH: 24.2 pg — ABNORMAL LOW (ref 26.0–34.0)
MCH: 25.8 pg — ABNORMAL LOW (ref 26.0–34.0)
MCHC: 30.7 g/dL (ref 30.0–36.0)
MCHC: 33.2 g/dL (ref 30.0–36.0)
MCV: 78.8 fL — ABNORMAL LOW (ref 80.0–100.0)
MCV: 77.6 fL — ABNORMAL LOW (ref 80.0–100.0)
Platelets: 138 10*3/uL — ABNORMAL LOW (ref 150–400)
Platelets: 146 10*3/uL — ABNORMAL LOW (ref 150–400)
RBC: 2.69 MIL/uL — ABNORMAL LOW (ref 3.87–5.11)
RBC: 3.22 MIL/uL — ABNORMAL LOW (ref 3.87–5.11)
RDW: 24.3 % — ABNORMAL HIGH (ref 11.5–15.5)
RDW: 23.2 % — ABNORMAL HIGH (ref 11.5–15.5)
WBC: 13.7 10*3/uL — ABNORMAL HIGH (ref 4.0–10.5)
WBC: 13 10*3/uL — ABNORMAL HIGH (ref 4.0–10.5)
nRBC: 0.1 % (ref 0.0–0.2)
nRBC: 0.3 % — ABNORMAL HIGH (ref 0.0–0.2)

## 2022-10-09 LAB — PREPARE RBC (CROSSMATCH)

## 2022-10-09 MED ORDER — FERROUS SULFATE 325 (65 FE) MG PO TABS: 325.0000 mg | ORAL_TABLET | Freq: Two times a day (BID) | ORAL | 3 refills | Status: DC

## 2022-10-09 MED ORDER — SODIUM CHLORIDE 0.9% IV SOLUTION: Freq: Once | INTRAVENOUS | Status: AC

## 2022-10-09 MED ORDER — IBUPROFEN 600 MG PO TABS: 600.0000 mg | ORAL_TABLET | Freq: Four times a day (QID) | ORAL | 0 refills | Status: DC | PRN

## 2022-10-09 MED ORDER — ACETAMINOPHEN 500 MG PO TABS: 1000.0000 mg | ORAL_TABLET | Freq: Four times a day (QID) | ORAL | 0 refills | Status: DC | PRN

## 2022-10-09 NOTE — Progress Notes (Signed)
Postpartum Day  0  Subjective: 22 y.o. G2X5284 postpartum day #0 status post normal spontaneous vaginal delivery. She is ambulating, is tolerating po, is voiding spontaneously.   Her pain is well controlled on PO pain medications. Her lochia is less than menses.  Objective: BP 116/68   Pulse 74   Temp 97.8 F (36.6 C) (Oral)   Resp 20   Ht 5\' 3"  (1.6 m)   Wt 77 kg   LMP 12/16/2021 (Exact Date)   SpO2 100%   Breastfeeding Unknown   BMI 30.07 kg/m    Physical Exam:  General: alert, cooperative, and appears stated age Breasts: soft/nontender Pulm: nl effort Abdomen: soft, non-tender, active bowel sounds Uterine Fundus: firm Perineum: moderate edema, laceration hemostatic, repair well approximated Lochia: inappropriate total blood loss is 965 cc since delivery this morning.   Recent Labs    10/08/22 1337 10/09/22 0509  HGB 8.0* 6.5*  HCT 25.1* 21.2*  WBC 22.0* 13.7*  PLT 174 138*    Assessment/Plan: 22 y.o. G2P2002 postpartum day # 0  1. Continue routine postpartum care   2.. Acute blood loss anemia - clinically significant.  --Hemodynamically unstable and symptomatic --Intervention: start on oral supplementation with ferrous sulfate 325 mg , IV iron transfusion with venofer ordered , continue to monitor H&H, and IV fluid bolus given -unable to perform a manual sweep of cervix/uterus due to intolerance.   Dr Dalbert Garnet aware of postpartum events   Disposition: continue inpatient postpartum care     ----- Chari Manning Certified Nurse Midwife [facility]

## 2022-10-09 NOTE — Anesthesia Postprocedure Evaluation (Signed)
Anesthesia Post Note  Patient: Deborah Cortez  Procedure(s) Performed: AN AD HOC LABOR EPIDURAL  Patient location during evaluation: Mother Baby Anesthesia Type: Epidural Level of consciousness: awake and alert Pain management: pain level controlled Vital Signs Assessment: post-procedure vital signs reviewed and stable Respiratory status: spontaneous breathing, nonlabored ventilation and respiratory function stable Cardiovascular status: stable Postop Assessment: no headache, no backache, able to ambulate, adequate PO intake and no apparent nausea or vomiting Anesthetic complications: no   No notable events documented.   Last Vitals:  Vitals:   10/08/22 2343 10/09/22 0817  BP: 116/68 108/69  Pulse: 74 81  Resp: 20 18  Temp: 36.6 C 36.8 C  SpO2: 100% 100%    Last Pain:  Vitals:   10/09/22 0817  TempSrc: Oral  PainSc:                  Lenard Simmer

## 2022-10-09 NOTE — Lactation Note (Signed)
This note was copied from a baby's chart. Lactation Consultation Note  Patient Name: Deborah Cortez [id number] Date: 10/09/2022 Age:22 hours     Maternal Data  First child would not latch, had tongue tie.   Feeding  BAby had not fed since taking formula at 945, awakened to feed, latched easily to both breasts and nursed 5 min on first and then 10 min on left in cradle hold.       LATCH Score                    Lactation Tools Discussed/Used    Interventions  Coconut oil for tender nipples, see flow sheet LC name and no written on white board   Discharge    Consult Status prn     [name] 10/09/2022, 6:21 PM

## 2022-10-09 NOTE — Progress Notes (Signed)
I directed the registered nurse to ensure that the patient was voiding her bladder every few hours to facilitate uterine contraction and maintain firmness. Additionally, a bladder scan was to be conducted. The nurse reported that the patient spontaneously voided 175 cc, with the bladder scan indicating a volume exceeding 390 cc. I subsequently ordered a straight catheterization; however, the nurse was unable to perform this procedure. Within the hour, the patient then spontaneously voided an additional 740 cc.  Chari Manning CNM

## 2022-10-09 NOTE — Progress Notes (Signed)
Post Partum Day 1 Subjective: Doing well, no complaints.  Tolerating regular diet, pain with PO meds, voiding and ambulating without difficulty.  No CP SOB Fever,Chills, N/V or leg pain; denies nipple or breast pain, no HA change of vision, RUQ/epigastric pain  Objective: BP 116/68   Pulse 74   Temp 97.8 F (36.6 C) (Oral)   Resp 20   Ht 5\' 3"  (1.6 m)   Wt 77 kg   LMP 12/16/2021 (Exact Date)   SpO2 100%   Breastfeeding Unknown   BMI 30.07 kg/m    Physical Exam:  General: NAD Breasts: soft/nontender CV: RRR Pulm: nl effort, CTABL Abdomen: soft, NT, BS x 4 Perineum:  minimal edema, repair well approximated Lochia: moderate Uterine Fundus: fundus firm and 1 fb below umbilicus DVT Evaluation: no cords, ttp LEs   Recent Labs    10/08/22 1337 10/09/22 0509  HGB 8.0* 6.5*  HCT 25.1* 21.2*  WBC 22.0* 13.7*  PLT 174 138*    Assessment/Plan: 22 y.o. G2P2002 postpartum day # 1  - Continue routine PP care - Lactation consult PRN  - Discussed contraceptive options including implant, IUDs hormonal and non-hormonal, injection, pills/ring/patch, condoms, and NFP.  - Acute blood loss anemia, clinically significant - hemoglobin changed from 9.9 to 6.5, patient is asymptomatic, hemodynamically stable; start po ferrous sulfate BID with stool softeners, received Venofer IV , patient will receive 1u PRBCs  - Chorio: received ampicillin and gentamicin for 24hrs, has been afebrile for 23hrs - Immunization status: Needs MMR prior to DC  Disposition: Does not desire Dc home today.   Janyce Llanos, CNM 10/09/2022 8:15 AM

## 2022-10-10 NOTE — Progress Notes (Signed)
Pt discharged with infant. Discharge instructions, prescriptions, and follow up appointments given to and reviewed with patient. Pt verbalized understanding. To be escorted out by staff. °

## 2022-10-11 LAB — BPAM RBC
Blood Product Expiration Date: 202409172359
Blood Product Expiration Date: 202409252359
ISSUE DATE / TIME: 202408311001
Unit Type and Rh: 5100
Unit Type and Rh: 5100

## 2022-10-11 LAB — TYPE AND SCREEN
ABO/RH(D): O POS
Antibody Screen: NEGATIVE
Unit division: 0
Unit division: 0

## 2022-10-14 ENCOUNTER — Telehealth: Payer: Self-pay

## 2022-10-14 NOTE — Telephone Encounter (Signed)
[facility]- Discharge Call Backs- Spoke to patient on the phone about the following below. 1-Do you have any questions or concerns about yourself as you heal?No 2-Any concerns or questions about your baby?No Is your baby eating, peeing,pooping well?Yes 3-Reviewed ABC's of safe sleep. 4-How was your stay at the hospital?Great 5- Did our team work together to care for you?Yes You should be receiving a survey in the mail soon.   We would really appreciate it if you could fill that out for [place] and return it in the mail.  We value the feedback to make improvements and continue the great work we do.   If you have any questions please feel free to call me back at [phone]

## 2022-11-08 ENCOUNTER — Encounter: Payer: Self-pay | Admitting: Obstetrics and Gynecology

## 2022-12-30 ENCOUNTER — Ambulatory Visit: Payer: Self-pay

## 2023-06-27 ENCOUNTER — Encounter: Payer: Self-pay | Admitting: *Deleted

## 2023-06-27 ENCOUNTER — Ambulatory Visit
Admission: EM | Admit: 2023-06-27 | Discharge: 2023-06-27 | Disposition: A | Discharge status: Home / Self Care | Attending: Emergency Medicine | Admitting: Emergency Medicine

## 2023-06-27 DIAGNOSIS — K625 Hemorrhage of anus and rectum: Secondary | ICD-10-CM | POA: Diagnosis present

## 2023-06-27 LAB — CBC WITH DIFFERENTIAL/PLATELET
Abs Immature Granulocytes: 0.03 10*3/uL (ref 0.00–0.07)
Basophils Absolute: 0 10*3/uL (ref 0.0–0.1)
Basophils Relative: 0 %
Eosinophils Absolute: 0.2 10*3/uL (ref 0.0–0.5)
Eosinophils Relative: 3 %
HCT: 38.3 % (ref 36.0–46.0)
Hemoglobin: 13.4 g/dL (ref 12.0–15.0)
Immature Granulocytes: 0 %
Lymphocytes Relative: 43 %
Lymphs Abs: 3.3 10*3/uL (ref 0.7–4.0)
MCH: 28 pg (ref 26.0–34.0)
MCHC: 35 g/dL (ref 30.0–36.0)
MCV: 80 fL (ref 80.0–100.0)
Monocytes Absolute: 0.6 10*3/uL (ref 0.1–1.0)
Monocytes Relative: 7 %
Neutro Abs: 3.7 10*3/uL (ref 1.7–7.7)
Neutrophils Relative %: 47 %
Platelets: 212 10*3/uL (ref 150–400)
RBC: 4.79 MIL/uL (ref 3.87–5.11)
RDW: 13 % (ref 11.5–15.5)
WBC: 7.8 10*3/uL (ref 4.0–10.5)
nRBC: 0 % (ref 0.0–0.2)

## 2023-06-27 NOTE — ED Provider Notes (Signed)
 HPI  SUBJECTIVE:  Deborah Cortez is a 23 y.o. female who presents with blood on the toilet paper after stooling every time for the past 2 days.  Blood dripping into the toilet earlier today.  No foul-smelling black or tarry stools, passage of clots, abdominal pain, chest pain, lightheadedness, shortness of breath, syncope, pain with stooling.  She has had issues with constipation recently.   No rectal masses.  She reports some rectal itching today.  She has never had symptoms like this before.  She drinks 32 ounces of water a day.  No urinary or vaginal complaints.  She has been taking fiber supplements for constipation.  No aggravating or alleviating factors.  She has a past medical history of anemia that required several transfusions during pregnancy and constipation.  She is not on any anticoagulants/antiplatelets, hemorrhoids, GI bleed, regular NSAID use.  LMP: Now.  Denies possibility of being pregnant. FH : father with hereditary cirrhosis.  [facility]    Past Medical History:  Diagnosis Date   Asthma    Orthodontics    braces    Past Surgical History:  Procedure Laterality Date   CESAREAN SECTION N/A 01/22/2019   Procedure: CESAREAN SECTION;  Surgeon: Schermerhorn, Joselyn Nicely, MD;  Location: [facility] ORS;  Service: Obstetrics;  Laterality: N/A;   TONSILLECTOMY     TONSILLECTOMY AND ADENOIDECTOMY N/A 03/09/2016   Procedure: TONSILLECTOMY AND ADENOIDECTOMY;  Surgeon: Von Grumbling, MD;  Location: [facility];  Service: ENT;  Laterality: N/A;    Family History  Problem Relation Age of Onset   Hyperlipidemia Mother        diet controlled   Other Father        unknown - no contact   Leukemia Maternal Grandmother     Social History   Tobacco Use   Smoking status: Never   Smokeless tobacco: Never  Vaping Use   Vaping status: Never Used  Substance Use Topics   Alcohol use: No   Drug use: Never    No current facility-administered medications for  this encounter.  Current Outpatient Medications:    acetaminophen  (TYLENOL ) 500 MG tablet, Take 2 tablets (1,000 mg total) by mouth every 6 (six) hours as needed for fever or mild pain., Disp: 30 tablet, Rfl: 0   albuterol  (VENTOLIN  HFA) 108 (90 Base) MCG/ACT inhaler, Inhale 1-2 puffs into the lungs every 4 (four) hours as needed for wheezing or shortness of breath., Disp: 1 each, Rfl: 0   ferrous sulfate  325 (65 FE) MG tablet, Take 1 tablet (325 mg total) by mouth 2 (two) times daily with a meal., Disp: 60 tablet, Rfl: 3   ibuprofen  (ADVIL ) 600 MG tablet, Take 1 tablet (600 mg total) by mouth every 6 (six) hours as needed for cramping or mild pain., Disp: 30 tablet, Rfl: 0   Prenatal Vit-Fe Fumarate-FA (PRENATAL MULTIVITAMIN) TABS tablet, Take 1 tablet by mouth daily at 12 noon., Disp: , Rfl:    Spacer/Aero-Holding Chambers (AEROCHAMBER MV) inhaler, Use as instructed, Disp: 1 each, Rfl: 1  No Known Allergies   ROS  As noted in HPI.   Physical Exam  BP 125/83 (BP Location: Right Arm)   Pulse 73   Temp 98.7 F (37.1 C) (Oral)   Resp 18   Ht 5\' 3"  (1.6 m)   Wt 85.5 kg   LMP 05/30/2023   SpO2 98%   BMI 33.41 kg/m   Constitutional: Well developed, well nourished, no acute distress Eyes:  EOMI, conjunctiva normal bilaterally HENT: Normocephalic, atraumatic,mucus membranes moist Respiratory: Normal inspiratory effort Cardiovascular: Normal rate GI: nondistended soft, nontender, normal appearance.  No guarding, rebound. Rectal: Normal external appearance.  No fissures.  No external hemorrhoids, prolapsing internal hemorrhoids with Valsalva.  No pain with digital rectal exam.  No black or tarry stools, or gross blood on glove.  Chaperone present during exam skin: No rash, skin intact Musculoskeletal: no deformities Neurologic: Alert & oriented x 3, no focal neuro deficits Psychiatric: Speech and behavior appropriate   ED Course   Medications - No data to display  Orders  Placed This Encounter  Procedures   CBC with Differential    Standing Status:   Standing    Number of Occurrences:   1    Results for orders placed or performed during the hospital encounter of 06/27/23 (from the past 24 hours)  CBC with Differential     Status: None   Collection Time: 06/27/23  8:21 PM  Result Value Ref Range   WBC 7.8 4.0 - 10.5 K/uL   RBC 4.79 3.87 - 5.11 MIL/uL   Hemoglobin 13.4 12.0 - 15.0 g/dL   HCT 04.5 40.9 - 81.1 %   MCV 80.0 80.0 - 100.0 fL   MCH 28.0 26.0 - 34.0 pg   MCHC 35.0 30.0 - 36.0 g/dL   RDW 91.4 78.2 - 95.6 %   Platelets 212 150 - 400 K/uL   nRBC 0.0 0.0 - 0.2 %   Neutrophils Relative % 47 %   Neutro Abs 3.7 1.7 - 7.7 K/uL   Lymphocytes Relative 43 %   Lymphs Abs 3.3 0.7 - 4.0 K/uL   Monocytes Relative 7 %   Monocytes Absolute 0.6 0.1 - 1.0 K/uL   Eosinophils Relative 3 %   Eosinophils Absolute 0.2 0.0 - 0.5 K/uL   Basophils Relative 0 %   Basophils Absolute 0.0 0.0 - 0.1 K/uL   Immature Granulocytes 0 %   Abs Immature Granulocytes 0.03 0.00 - 0.07 K/uL   No results found.  ED Clinical Impression  1. Rectal bleeding      ED Assessment/Plan     Will check a CBC as a baseline as last hemoglobin was 8.3 in August 2024.  She is postpartum.  She had anemia that required transfusions during pregnancy.  Deferring Hemoccult as it would not change management.  I doubt a clinically significant upper or lower GI bleed.  Hgb 13.4  MiraLAX, increase fluids to 2 L of water a day, increase fruit and vegetable intake, continue fiber supplement.  Rinse after stooling.  Follow Up with PCP for referral to GI if this continues for colonoscopy.  I will also give her [facility]'s information for evaluation and possible colonoscopy. May also benefit from genetic counseling due to father's hx inherited cirrhosis.  Strict ER return precautions given.  Discussed labs MDM, treatment plan, and plan for follow-up with patient. Discussed sn/sx that should  prompt return to the ED. patient agrees with plan.   No orders of the defined types were placed in this encounter.     *This clinic note was created using Dragon dictation software. Therefore, there may be occasional mistakes despite careful proofreading.  ?    Ethlyn Herd, MD 06/27/23 2037

## 2023-06-27 NOTE — ED Triage Notes (Signed)
 Patient states Sat she wiped after a bowel movement and there was blood on the tissue and has happened daily, today when she pooped it turned toilet water red but she is unsure what color the stool was.  No abdominal pain or bloating

## 2023-06-27 NOTE — Discharge Instructions (Addendum)
 Today's hemoglobin is 13.4. MiraLAX, increase fluids to 2 L of water a day, increase fruit and vegetable intake, continue fiber supplement.  Rinse after stooling.  Follow Up with PCP for referral to GI if this continues for colonoscopy. Go the ER for foul smelling black, tarry stools, if you start passing clots, or for any of the other signs and symptoms we discussed.

## 2023-06-28 ENCOUNTER — Ambulatory Visit: Payer: Self-pay | Admitting: Emergency Medicine

## 2024-02-20 ENCOUNTER — Other Ambulatory Visit: Payer: Self-pay

## 2024-02-20 ENCOUNTER — Encounter: Payer: Self-pay | Admitting: Emergency Medicine

## 2024-02-20 ENCOUNTER — Ambulatory Visit

## 2024-02-20 ENCOUNTER — Ambulatory Visit
Admission: EM | Admit: 2024-02-20 | Discharge: 2024-02-20 | Disposition: A | Discharge status: Home / Self Care | Source: Home / Self Care | Attending: Emergency Medicine | Admitting: Emergency Medicine

## 2024-02-20 DIAGNOSIS — N12 Tubulo-interstitial nephritis, not specified as acute or chronic: Secondary | ICD-10-CM | POA: Insufficient documentation

## 2024-02-20 DIAGNOSIS — R1030 Lower abdominal pain, unspecified: Secondary | ICD-10-CM | POA: Diagnosis not present

## 2024-02-20 DIAGNOSIS — R509 Fever, unspecified: Secondary | ICD-10-CM | POA: Insufficient documentation

## 2024-02-20 LAB — POCT URINE DIPSTICK
Glucose, UA: NEGATIVE mg/dL
Ketones, POC UA: NEGATIVE mg/dL
Leukocytes, UA: NEGATIVE
Nitrite, UA: NEGATIVE
Protein Ur, POC: 30 mg/dL — AB
Spec Grav, UA: 1.025
Urobilinogen, UA: 0.2 U/dL
pH, UA: 5.5

## 2024-02-20 LAB — POC COVID19/FLU A&B COMBO
Covid Antigen, POC: NEGATIVE
Influenza A Antigen, POC: NEGATIVE
Influenza B Antigen, POC: NEGATIVE

## 2024-02-20 LAB — POCT URINE PREGNANCY: Preg Test, Ur: NEGATIVE

## 2024-02-20 MED ORDER — ACETAMINOPHEN 325 MG PO TABS
975.0000 mg | ORAL_TABLET | Freq: Once | ORAL | Status: AC
  Administered 2024-02-20: 975 mg via ORAL

## 2024-02-20 MED ORDER — CEFTRIAXONE SODIUM 1 G IJ SOLR
1.0000 g | Freq: Once | INTRAMUSCULAR | Status: AC
  Administered 2024-02-20: 1 g via INTRAMUSCULAR

## 2024-02-20 MED ORDER — ONDANSETRON 8 MG PO TBDP: ORAL_TABLET | ORAL | 0 refills | Status: AC

## 2024-02-20 MED ORDER — IBUPROFEN 800 MG PO TABS
800.0000 mg | ORAL_TABLET | Freq: Once | ORAL | Status: AC
  Administered 2024-02-20: 800 mg via ORAL

## 2024-02-20 MED ORDER — CEFDINIR 300 MG PO CAPS: 300.0000 mg | ORAL_CAPSULE | Freq: Two times a day (BID) | ORAL | 0 refills | Status: AC

## 2024-02-20 MED ORDER — IBUPROFEN 600 MG PO TABS: 600.0000 mg | ORAL_TABLET | Freq: Four times a day (QID) | ORAL | 0 refills | Status: AC | PRN

## 2024-02-20 NOTE — Discharge Instructions (Signed)
 Urine pregnancy negative.  COVID, flu negative.  Urinalysis has blood, but negative for infection.  However, I have seen urine dips being negative before but you actually have a urinary tract infection.  I am sending your urine off for culture.  I am concerned that you have a kidney infection.  I have given you 1000 g of Rocephin  here and I am sending you home on Omnicef  for 10 days.  Make sure you drink plenty of extra electrolyte containing fluids such as Pedialyte, liquid IV, Gatorade.  Zofran  for nausea, vomiting.  This will also slow down the diarrhea.  I did not appreciate any pneumonia or kidney stones on your x-ray.  We will contact you if the radiology overread differs enough from mine and we need to change management.  Go to the ER if you get worse, or for any concerns.

## 2024-02-20 NOTE — ED Triage Notes (Signed)
 Symptoms started yesterday and include lower abdominal pain, headache, nausea, fatigue, both ears feeling full.  One episode of vomiting this morning.  2 episodes of diarrhea.  Reports a fever last night  Has taken tylenol -last dose last night.    Also complains of an iron  taste in her mouth

## 2024-02-20 NOTE — ED Provider Notes (Signed)
 " HPI  SUBJECTIVE:  Deborah Cortez is a 24 y.o. female who presents with constant, stabbing lower abdominal pain starting yesterday that radiates into her midline lower back.  It has not concentrated into any specific area.  No urinary or vaginal complaints.  She reports fevers Tmax 102.4, body aches, headaches, nausea, 1 episode of nonbilious, nonbloody emesis, and 3 episodes of watery, nonbloody diarrhea.  She reports anorexia.  She also reports bilateral ear stuffiness, and lightheadedness.  No pain.  No nasal congestion, sore throat, coughing, wheezing, shortness of breath, abdominal distention.  Car ride over here was not painful.  No known COVID or flu exposure, or exposure to GI illness.  She got 2 doses of the COVID vaccine and this years flu vaccine.  She is in a long-term monogamous relationship with a female, who is asymptomatic.  STDs are not a concern today.  No antipyretic in the past 6 hours.  No antibiotics in the past 3 months.  She tried ibuprofen  400 mg with improvement in her symptoms.  Symptoms are worse when she bends forward.  She has a past medical history of asthma, ovarian cyst and is status post tonsillectomy/adenoidectomy, C-section.  No history of UTI, pyelonephritis, nephrolithiasis, PID, PCOS, TOA, ovarian torsion, appendicitis.  Family history negative for nephrolithiasis.  LMP: Last week at summer.  Not sure if she could be pregnant.  [facility]  Past Medical History:  Diagnosis Date   Asthma    Orthodontics    braces    Past Surgical History:  Procedure Laterality Date   CESAREAN SECTION N/A 01/22/2019   Procedure: CESAREAN SECTION;  Surgeon: Schermerhorn, Debby PARAS, MD;  Location: [facility];  Service: Obstetrics;  Laterality: N/A;   TONSILLECTOMY     TONSILLECTOMY AND ADENOIDECTOMY N/A 03/09/2016   Procedure: TONSILLECTOMY AND ADENOIDECTOMY;  Surgeon: Deward Dolly, MD;  Location: [facility];  Service: ENT;  Laterality: N/A;     Family History  Problem Relation Age of Onset   Hyperlipidemia Mother        diet controlled   Other Father        unknown - no contact   Leukemia Maternal Grandmother     Social History[1]  Current Medications[2]  Allergies[3]   ROS  As noted in HPI.   Physical Exam  BP 117/83 (BP Location: Right Arm)   Pulse 99   Temp (!) 100.4 F (38 C) (Oral)   Resp 18   LMP 01/30/2024   SpO2 98%   Constitutional: Well developed, well nourished, no acute distress Eyes: PERRL, EOMI, conjunctiva normal bilaterally HENT: Normocephalic, atraumatic,mucus membranes moist.  TMs normal bilaterally.  Mild nasal congestion.  Normal turbinates.  No maxillary, frontal sinus tenderness. Neck: No cervical lymphadenopathy Respiratory: Clear to auscultation bilaterally, wheezing right lower lobe Cardiovascular: Normal rate and rhythm, no murmurs, no gallops, no rubs GI: Soft, nondistended, normal bowel sounds, tenderness maximal in the suprapubic, flank regions.  Negative Murphy, negative McBurney.  Negative tap table test.   Back: Right CVAT skin: No rash, skin intact Musculoskeletal:  no deformities Neurologic: Alert & oriented x 3, CN III-XII grossly intact, no motor deficits, sensation grossly intact Psychiatric: Speech and behavior appropriate   ED Course   Medications  acetaminophen  (TYLENOL ) tablet 975 mg (975 mg Oral Given 02/20/24 1037)  ibuprofen  (ADVIL ) tablet 800 mg (800 mg Oral Given 02/20/24 1037)  cefTRIAXone  (ROCEPHIN ) injection 1 g (1 g Intramuscular Given 02/20/24 1145)    Orders Placed  This Encounter  Procedures   Urine Culture    Standing Status:   Standing    Number of Occurrences:   1    Indication:   Suprapubic pain   DG Chest 2 View    Standing Status:   Standing    Number of Occurrences:   1    Reason for Exam (SYMPTOM  OR DIAGNOSIS REQUIRED):   Wheezing left lower lobe, fever rule out pneumonia   DG Abd 1 View    Standing Status:   Standing    Number  of Occurrences:   1    Reason for Exam (SYMPTOM  OR DIAGNOSIS REQUIRED):   Fever, lower abdominal pain, hematuria rule out nephrolithiasis   POC Urinalysis Dipstick    Standing Status:   Standing    Number of Occurrences:   1   POCT urine pregnancy    Standing Status:   Standing    Number of Occurrences:   1   POC Covid19/Flu A&B Antigen    Standing Status:   Standing    Number of Occurrences:   1   No results found for this or any previous visit (from the past 24 hours).  No results found. Results for orders placed or performed during the hospital encounter of 02/20/24  Urine Culture   Collection Time: 02/20/24 10:16 AM   Specimen: Urine, Clean Catch  Result Value Ref Range   Specimen Description      URINE, CLEAN CATCH Performed at [facility], [address], [place], [place] [address]    Special Requests      NONE Performed at [facility], [address] [place], [place] [address]    Culture      NO GROWTH Performed at [facility], [address], [place], [place] [address]    Report Status 02/21/2024 FINAL   POC Urinalysis Dipstick   Collection Time: 02/20/24 10:16 AM  Result Value Ref Range   Color, UA orange (A) yellow   Clarity, UA cloudy (A) clear   Glucose, UA negative negative mg/dL   Bilirubin, UA small (A) negative   Ketones, POC UA negative negative mg/dL   Spec Grav, UA 8.974 8.989 - 1.025   Blood, UA moderate (A) negative   pH, UA 5.5 5.0 - 8.0   Protein Ur, POC =30 (A) negative mg/dL   Urobilinogen, UA 0.2 0.2 or 1.0 E.U./dL   Nitrite, UA Negative Negative   Leukocytes, UA Negative Negative  POCT urine pregnancy   Collection Time: 02/20/24 10:19 AM  Result Value Ref Range   Preg Test, Ur Negative Negative  POC Covid19/Flu A&B Antigen   Collection Time: 02/20/24 11:22 AM  Result Value Ref Range   Influenza A Antigen, POC Negative Negative   Influenza B Antigen, POC Negative Negative   Covid Antigen, POC  Negative Negative   DG Abd 1 View Result Date: 02/20/2024 EXAM: 1 VIEW XRAY OF THE ABDOMEN 02/20/2024 11:04:14 AM COMPARISON: Fever and lower abdominal pain. CLINICAL HISTORY: Fever, lower abdominal pain, hematuria rule out nephrolithiasis. FINDINGS: BOWEL: Nonobstructive bowel gas pattern. SOFT TISSUES: No abnormal calcifications. BONES: No acute fracture. IMPRESSION: 1. No acute findings. Electronically signed by: Waddell Calk MD MD 02/20/2024 12:54 PM EST RP Workstation: [id number]   DG Chest 2 View Result Date: 02/20/2024 EXAM: 2 VIEW(S) XRAY OF THE CHEST 02/20/2024 11:04:14 AM COMPARISON: None available. CLINICAL HISTORY: Wheezing left lower lobe, fever rule out pneumonia. FINDINGS: LUNGS AND PLEURA: No focal  pulmonary opacity. No pleural effusion. No pneumothorax. HEART AND MEDIASTINUM: No acute abnormality of the cardiac and mediastinal silhouettes. BONES AND SOFT TISSUES: No acute osseous abnormality. IMPRESSION: 1. No acute findings. 2. No focal consolidation to suggest pneumonia. Electronically signed by: Waddell Calk MD MD 02/20/2024 12:52 PM EST RP Workstation: [id number]     ED Clinical Impression  1. Pyelonephritis   2. Lower abdominal pain   3. Fever, unspecified      ED Assessment/Plan    Patient presents with an acute illness with systemic symptoms of fever.  She has diffuse abdominal tenderness maximal in the suprapubic, flank region.  Negative McBurney.  She has no peritoneal signs.  Negative Murphy.  No guarding, rebound.  Negative Rovsing.  Low suspicion for appendicitis at this time.  Concern for UTI versus pyelonephritis given right CVAT.  She has a large hematuria and proteinuria, but no nitrites or esterase.  I will send this off for culture.  Checking KUB to evaluate for nephrolithiasis.  Also checking chest x-ray due to left lower lobe wheezing.  Will also check COVID and flu.  Reviewed imaging independently.  No radiopaque stones or acute cardiopulmonary disease  as read by me.  Formal radiology overread pending.  Will contact patient if radiology overread differs enough from mine and we need to change management  COVID, flu negative.  Reviewed radiology reports.  No pneumonia, radiopaque stones consistent with my read. See radiology report for full details.  Imaging negative for pneumonia, stones  Will treat as a pyelonephritis with 1000 mg of Rocephin  here, Tylenol /ibuprofen , Omnicef  300 mg twice daily for 10 days.  Push electrolyte containing fluids.  Tylenol  combined with ibuprofen , Zofran  for nausea, diarrhea.  Urine culture sent.  Follow-up with PCP in several days.  ER return precautions given.  Discussed labs, imaging, MDM, treatment plan, and plan for follow-up with patient Discussed sn/sx that should prompt return to the ED. patient agrees with plan.   Meds ordered this encounter  Medications   acetaminophen  (TYLENOL ) tablet 975 mg   ibuprofen  (ADVIL ) tablet 800 mg   cefTRIAXone  (ROCEPHIN ) injection 1 g   cefdinir  (OMNICEF ) 300 MG capsule    Sig: Take 1 capsule (300 mg total) by mouth 2 (two) times daily.    Dispense:  20 capsule    Refill:  0   ibuprofen  (ADVIL ) 600 MG tablet    Sig: Take 1 tablet (600 mg total) by mouth every 6 (six) hours as needed.    Dispense:  30 tablet    Refill:  0   ondansetron  (ZOFRAN -ODT) 8 MG disintegrating tablet    Sig: 1/2- 1 tablet q 8 hr prn nausea, vomiting    Dispense:  20 tablet    Refill:  0      *This clinic note was created using Scientist, clinical (histocompatibility and immunogenetics). Therefore, there may be occasional mistakes despite careful proofreading. ?      [1]  Social History Tobacco Use   Smoking status: Never   Smokeless tobacco: Never  Vaping Use   Vaping status: Never Used  Substance Use Topics   Alcohol use: No   Drug use: Never  [2] No current facility-administered medications for this encounter.  Current Outpatient Medications:    cefdinir  (OMNICEF ) 300 MG capsule, Take 1 capsule (300 mg  total) by mouth 2 (two) times daily., Disp: 20 capsule, Rfl: 0   ibuprofen  (ADVIL ) 600 MG tablet, Take 1 tablet (600 mg total) by mouth every 6 (six) hours as needed., Disp:  30 tablet, Rfl: 0   ondansetron  (ZOFRAN -ODT) 8 MG disintegrating tablet, 1/2- 1 tablet q 8 hr prn nausea, vomiting, Disp: 20 tablet, Rfl: 0 [3] No Known Allergies    Van Knee, MD 02/24/24 1543  "

## 2024-02-21 ENCOUNTER — Ambulatory Visit (HOSPITAL_COMMUNITY): Payer: Self-pay

## 2024-02-21 LAB — URINE CULTURE: Culture: NO GROWTH
